# Patient Record
Sex: Female | Born: 1980 | Hispanic: Yes | Marital: Married | Smoking: Never smoker
Health system: Southern US, Community
[De-identification: ages and names within clinical notes are randomized; demographics above are authoritative.]

## PROBLEM LIST (undated history)

## (undated) DIAGNOSIS — R112 Nausea with vomiting, unspecified: Secondary | ICD-10-CM

## (undated) DIAGNOSIS — Z9889 Other specified postprocedural states: Secondary | ICD-10-CM

## (undated) DIAGNOSIS — D649 Anemia, unspecified: Secondary | ICD-10-CM

## (undated) DIAGNOSIS — K219 Gastro-esophageal reflux disease without esophagitis: Secondary | ICD-10-CM

## (undated) HISTORY — PX: HEMORRHOID SURGERY: SHX153

## (undated) HISTORY — DX: Anemia, unspecified: D64.9

---

## 2021-08-30 ENCOUNTER — Other Ambulatory Visit: Payer: Self-pay

## 2021-08-30 DIAGNOSIS — Z1231 Encounter for screening mammogram for malignant neoplasm of breast: Secondary | ICD-10-CM

## 2021-09-02 ENCOUNTER — Other Ambulatory Visit: Payer: Self-pay

## 2021-09-02 DIAGNOSIS — N644 Mastodynia: Secondary | ICD-10-CM

## 2021-09-23 ENCOUNTER — Other Ambulatory Visit: Payer: Self-pay

## 2021-09-23 ENCOUNTER — Ambulatory Visit: Payer: Self-pay | Admitting: *Deleted

## 2021-09-23 ENCOUNTER — Ambulatory Visit: Payer: Self-pay

## 2021-09-23 ENCOUNTER — Ambulatory Visit
Admission: RE | Admit: 2021-09-23 | Discharge: 2021-09-23 | Disposition: A | Discharge status: Home / Self Care | Payer: No Typology Code available for payment source | Source: Ambulatory Visit | Attending: Obstetrics and Gynecology | Admitting: Obstetrics and Gynecology

## 2021-09-23 ENCOUNTER — Other Ambulatory Visit: Payer: Self-pay | Admitting: Obstetrics and Gynecology

## 2021-09-23 VITALS — BP 126/80 | Wt 137.0 lb

## 2021-09-23 DIAGNOSIS — N644 Mastodynia: Secondary | ICD-10-CM

## 2021-09-23 DIAGNOSIS — N6489 Other specified disorders of breast: Secondary | ICD-10-CM

## 2021-09-23 DIAGNOSIS — Z01419 Encounter for gynecological examination (general) (routine) without abnormal findings: Secondary | ICD-10-CM

## 2021-09-23 NOTE — Progress Notes (Addendum)
[name] is a 41 y.o. No obstetric history on file. female who presents to [facility] clinic today with complaint of bilateral outer breast pain x 3-4 years that comes and goes. Patient rates the pain at a 10 out of 10. Patient complained of a left breast lump x 2 years.    Pap Smear: Pap smear completed today. Last Pap smear was 3-4 years ago at a clinic in [place] and was normal. Per patient has no history of an abnormal Pap smear. Last Pap smear result is not available in Epic.   Physical exam: Breasts Breasts symmetrical. No skin abnormalities bilateral breasts. No nipple retraction bilateral breasts. No nipple discharge bilateral breasts. No lymphadenopathy. No lumps palpated bilateral breasts. Unable to palpate any lumps in patients area of concern within the left breast. Complained of bilateral outer breast pain on exam.      MS DIGITAL DIAG TOMO BILAT  Result Date: 09/23/2021 CLINICAL DATA:  Patient presents for diffuse bilateral breast tenderness. EXAM: DIGITAL DIAGNOSTIC BILATERAL MAMMOGRAM WITH TOMOSYNTHESIS AND CAD; ULTRASOUND RIGHT BREAST LIMITED TECHNIQUE: Bilateral digital diagnostic mammography and breast tomosynthesis was performed. The images were evaluated with computer-aided detection.; Targeted ultrasound examination of the right breast was performed COMPARISON:  None. ACR Breast Density Category c: The breast tissue is heterogeneously dense, which may obscure small masses. FINDINGS: Within the upper-outer right breast middle depth there is persistent subtle distortion, further evaluated with additional imaging. No additional concerning findings within either breast. On physical exam, dense tissue is palpated upper-outer right breast. Targeted ultrasound is performed, showing no sonographic correlate for the subtle distortion within the upper-outer right breast. No right axillary adenopathy. IMPRESSION: Persistent subtle distortion upper-outer right breast. RECOMMENDATION:  Stereotactic guided core needle biopsy subtle distortion upper-outer right breast. I have discussed the findings and recommendations with the patient. If applicable, a reminder letter will be sent to the patient regarding the next appointment. BI-RADS CATEGORY  4: Suspicious. Electronically Signed   By: Lovey Newcomer M.D.   On: 09/23/2021 14:40    Pelvic/Bimanual Ext Genitalia No lesions, no swelling and no discharge observed on external genitalia.        Vagina Vagina pink and normal texture. No lesions or discharge observed in vagina.        Cervix Cervix is present. Cervix pink and of normal texture. IUD strings visualized. Thick yellow mucous like discharge observed on IUD strings. Patient stated she has had her IUD for 8 years and that she would like it renewed. Patient unsure which type of IUD she has. Explained to patient that if it is a 5-year IUD that she needs to use another form of birth control. Explained to [facility] that [facility] will not cover to have her IUD removed. Explained the [facility] Application and patient given the application to complete. Referral sent to the [facility] for [place] for follow up.   Uterus Uterus is present and palpable. Uterus in normal position and normal size.        Adnexae Bilateral ovaries present and palpable. No tenderness on palpation.         Rectovaginal No rectal exam completed today since patient had no rectal complaints. No skin abnormalities observed on exam.     Smoking History: Patient has never smoked.   Patient Navigation: Patient education provided. Access to services provided for patient through [place] program. Spanish interpreter Rudene Anda from [facility] provided.    Breast and Cervical Cancer Risk Assessment:  Patient does not have family history of breast cancer, known genetic mutations, or radiation treatment to the chest before age 62. Patient does not have history of cervical dysplasia,  immunocompromised, or DES exposure in-utero.  Risk Assessment     Risk Scores       09/23/2021   Last edited by: Demetrius Revel, LPN   5-year risk: 0.4 %   Lifetime risk: 7.2 %             A: [facility] exam with pap smear Complaint of bilateral outer breast pain.  P: Referred patient to the [facility] for a diagnostic mammogram. Appointment scheduled Thursday, September 23, 2021 at 1240.  Loletta Parish, RN 09/23/2021 11:08 AM

## 2021-09-23 NOTE — Patient Instructions (Signed)
Explained breast self awareness with [name]. Pap smear completed today. Let her know [facility] will cover Pap smears and HPV typing every 5 years unless has a history of abnormal Pap smears. Referred patient to the [facility] for a diagnostic mammogram. Appointment scheduled Thursday, September 23, 2021 at 1240. Patient aware of appointment and will be there. Let patient know will follow up with her within the next couple weeks with results of her Pap smear by phone. [name] verbalized understanding.  Adalin Vanderploeg, Kathaleen Maser, RN 11:08 AM

## 2021-09-24 LAB — CYTOLOGY - PAP
Comment: NEGATIVE
Diagnosis: NEGATIVE
High risk HPV: NEGATIVE

## 2021-09-28 ENCOUNTER — Telehealth: Payer: Self-pay | Admitting: Family Medicine

## 2021-09-28 NOTE — Telephone Encounter (Signed)
Called patient with interpreter to schedule appointment, there was no answer so a voicemail was left with the call back number for the office.

## 2021-09-29 ENCOUNTER — Other Ambulatory Visit: Payer: Self-pay

## 2021-09-29 ENCOUNTER — Telehealth: Payer: Self-pay

## 2021-09-29 MED ORDER — METRONIDAZOLE 500 MG PO TABS: 500.0000 mg | ORAL_TABLET | Freq: Two times a day (BID) | ORAL | 0 refills | Status: AC

## 2021-09-29 MED ORDER — FLUCONAZOLE 150 MG PO TABS: 150.0000 mg | ORAL_TABLET | Freq: Once | ORAL | 0 refills | Status: AC

## 2021-09-29 NOTE — Telephone Encounter (Signed)
Via Natale Lay, Spanish Interpreter [facility]), Patient informed Pap/HPV negative, next pap due in 5 years. Patient also informed pap smear was positive for yeast infection and bacterial vaginitis. Patient stated that she is having some vaginal itching, odor, aned discharge-green. Prescriptions for Diflucan and Flagyl called to [facility] ([address] Patient informed (per Fonnie Mu, RN) to complete Flagyl first, then take Diflucan, and needs to be evaluated for green discharge at the [facility]. Patient verbalized understanding.

## 2021-10-06 ENCOUNTER — Ambulatory Visit
Admission: RE | Admit: 2021-10-06 | Discharge: 2021-10-06 | Disposition: A | Discharge status: Home / Self Care | Payer: No Typology Code available for payment source | Source: Ambulatory Visit | Attending: Obstetrics and Gynecology | Admitting: Obstetrics and Gynecology

## 2021-10-06 DIAGNOSIS — N644 Mastodynia: Secondary | ICD-10-CM

## 2021-10-10 ENCOUNTER — Telehealth: Payer: Self-pay | Admitting: Radiology

## 2021-10-10 NOTE — Telephone Encounter (Signed)
Left voicemail via interpreter to call [facility] to schedule New GYN appt for IUD removal (has had IUD for 8 yrs). No insurance, patient aware that [facility] will not cover IUD removal. Patient was given [facility] Financial assistance application.

## 2021-11-04 ENCOUNTER — Ambulatory Visit: Payer: Self-pay | Admitting: Surgery

## 2021-11-04 DIAGNOSIS — N6311 Unspecified lump in the right breast, upper outer quadrant: Secondary | ICD-10-CM

## 2021-11-04 DIAGNOSIS — N6310 Unspecified lump in the right breast, unspecified quadrant: Secondary | ICD-10-CM

## 2021-11-05 ENCOUNTER — Other Ambulatory Visit: Payer: Self-pay | Admitting: Surgery

## 2021-11-05 DIAGNOSIS — N6310 Unspecified lump in the right breast, unspecified quadrant: Secondary | ICD-10-CM

## 2021-11-19 ENCOUNTER — Other Ambulatory Visit: Payer: Self-pay

## 2021-11-19 ENCOUNTER — Encounter (HOSPITAL_BASED_OUTPATIENT_CLINIC_OR_DEPARTMENT_OTHER): Payer: Self-pay | Admitting: Surgery

## 2021-11-23 ENCOUNTER — Ambulatory Visit: Payer: Self-pay | Admitting: Surgery

## 2021-11-23 DIAGNOSIS — N6489 Other specified disorders of breast: Secondary | ICD-10-CM

## 2021-11-29 ENCOUNTER — Ambulatory Visit
Admission: RE | Admit: 2021-11-29 | Discharge: 2021-11-29 | Disposition: A | Discharge status: Home / Self Care | Payer: No Typology Code available for payment source | Source: Ambulatory Visit | Attending: Surgery | Admitting: Surgery

## 2021-11-29 NOTE — Anesthesia Preprocedure Evaluation (Addendum)
Anesthesia Evaluation  ?Patient identified by MRN, date of birth, ID band ?Patient awake ? ? ? ?Reviewed: ?Allergy & Precautions, NPO status , Patient's Chart, lab work & pertinent test results ? ?History of Anesthesia Complications ?(+) PONV and history of anesthetic complications ? ?Airway ?Mallampati: II ? ?TM Distance: >3 FB ?Neck ROM: Full ? ? ? Dental ?no notable dental hx. ? ?  ?Pulmonary ?neg pulmonary ROS,  ?  ?Pulmonary exam normal ? ? ? ? ? ? ? Cardiovascular ?negative cardio ROS ?Normal cardiovascular exam ? ? ?  ?Neuro/Psych ?negative neurological ROS ? negative psych ROS  ? GI/Hepatic ?Neg liver ROS, GERD  Controlled,  ?Endo/Other  ?negative endocrine ROS ? Renal/GU ?negative Renal ROS  ?negative genitourinary ?  ?Musculoskeletal ?negative musculoskeletal ROS ?(+)  ? Abdominal ?  ?Peds ? Hematology ?negative hematology ROS ?(+)   ?Anesthesia Other Findings ?RIGHT BREAST RADIAL SCAR ? Reproductive/Obstetrics ?negative OB ROS ? ?  ? ? ? ? ? ? ? ? ? ? ? ? ? ?  ?  ? ? ? ? ? ? ? ?Anesthesia Physical ?Anesthesia Plan ? ?ASA: 2 ? ?Anesthesia Plan: General  ? ?Post-op Pain Management: Tylenol PO (pre-op)* and Toradol IV (intra-op)*  ? ?Induction: Intravenous ? ?PONV Risk Score and Plan: 4 or greater and Midazolam, Scopolamine patch - Pre-op, Treatment may vary due to age or medical condition, Dexamethasone and Ondansetron ? ?Airway Management Planned: LMA ? ?Additional Equipment: None ? ?Intra-op Plan:  ? ?Post-operative Plan: Extubation in OR ? ?Informed Consent: I have reviewed the patients History and Physical, chart, labs and discussed the procedure including the risks, benefits and alternatives for the proposed anesthesia with the patient or authorized representative who has indicated his/her understanding and acceptance.  ? ? ? ?Dental advisory given and Interpreter used for interveiw ? ?Plan Discussed with: CRNA ? ?Anesthesia Plan Comments:   ? ? ? ? ? ?Anesthesia Quick  Evaluation ? ?

## 2021-11-29 NOTE — Progress Notes (Signed)

## 2021-11-30 ENCOUNTER — Ambulatory Visit (HOSPITAL_BASED_OUTPATIENT_CLINIC_OR_DEPARTMENT_OTHER)
Admission: RE | Admit: 2021-11-30 | Discharge: 2021-11-30 | Disposition: A | Discharge status: Home / Self Care | Payer: No Typology Code available for payment source | Source: Ambulatory Visit | Attending: Surgery | Admitting: Surgery

## 2021-11-30 ENCOUNTER — Encounter (HOSPITAL_BASED_OUTPATIENT_CLINIC_OR_DEPARTMENT_OTHER)
Admission: RE | Disposition: A | Discharge status: Home / Self Care | Payer: Self-pay | Source: Ambulatory Visit | Attending: Surgery

## 2021-11-30 ENCOUNTER — Other Ambulatory Visit: Payer: Self-pay

## 2021-11-30 ENCOUNTER — Ambulatory Visit
Admission: RE | Admit: 2021-11-30 | Discharge: 2021-11-30 | Disposition: A | Discharge status: Home / Self Care | Payer: No Typology Code available for payment source | Source: Ambulatory Visit | Attending: Surgery | Admitting: Surgery

## 2021-11-30 ENCOUNTER — Ambulatory Visit (HOSPITAL_BASED_OUTPATIENT_CLINIC_OR_DEPARTMENT_OTHER): Payer: No Typology Code available for payment source | Admitting: Anesthesiology

## 2021-11-30 ENCOUNTER — Encounter (HOSPITAL_BASED_OUTPATIENT_CLINIC_OR_DEPARTMENT_OTHER): Payer: Self-pay | Admitting: Surgery

## 2021-11-30 DIAGNOSIS — K219 Gastro-esophageal reflux disease without esophagitis: Secondary | ICD-10-CM | POA: Insufficient documentation

## 2021-11-30 DIAGNOSIS — N6310 Unspecified lump in the right breast, unspecified quadrant: Secondary | ICD-10-CM

## 2021-11-30 DIAGNOSIS — N6021 Fibroadenosis of right breast: Secondary | ICD-10-CM | POA: Insufficient documentation

## 2021-11-30 DIAGNOSIS — N6489 Other specified disorders of breast: Secondary | ICD-10-CM

## 2021-11-30 HISTORY — PX: BREAST LUMPECTOMY WITH RADIOACTIVE SEED LOCALIZATION: SHX6424

## 2021-11-30 HISTORY — DX: Nausea with vomiting, unspecified: Z98.890

## 2021-11-30 HISTORY — DX: Other specified postprocedural states: R11.2

## 2021-11-30 HISTORY — DX: Gastro-esophageal reflux disease without esophagitis: K21.9

## 2021-11-30 LAB — POCT PREGNANCY, URINE: Preg Test, Ur: NEGATIVE

## 2021-11-30 SURGERY — BREAST LUMPECTOMY WITH RADIOACTIVE SEED LOCALIZATION
Anesthesia: General | Site: Breast | Laterality: Right

## 2021-11-30 MED ORDER — ONDANSETRON HCL 4 MG/2ML IJ SOLN
INTRAMUSCULAR | Status: DC | PRN
  Administered 2021-11-30: 4 mg via INTRAVENOUS

## 2021-11-30 MED ORDER — ACETAMINOPHEN 500 MG PO TABS
ORAL_TABLET | ORAL | Status: AC
  Filled 2021-11-30: qty 2

## 2021-11-30 MED ORDER — KETOROLAC TROMETHAMINE 30 MG/ML IJ SOLN
INTRAMUSCULAR | Status: AC
  Filled 2021-11-30: qty 1

## 2021-11-30 MED ORDER — FENTANYL CITRATE (PF) 100 MCG/2ML IJ SOLN
INTRAMUSCULAR | Status: AC
  Filled 2021-11-30: qty 2

## 2021-11-30 MED ORDER — LIDOCAINE 2% (20 MG/ML) 5 ML SYRINGE
INTRAMUSCULAR | Status: AC
  Filled 2021-11-30: qty 5

## 2021-11-30 MED ORDER — SCOPOLAMINE 1 MG/3DAYS TD PT72
1.0000 | MEDICATED_PATCH | Freq: Once | TRANSDERMAL | Status: DC
  Administered 2021-11-30: 1.5 mg via TRANSDERMAL

## 2021-11-30 MED ORDER — FENTANYL CITRATE (PF) 100 MCG/2ML IJ SOLN
INTRAMUSCULAR | Status: DC | PRN
  Administered 2021-11-30 (×2): 25 ug via INTRAVENOUS
  Administered 2021-11-30: 50 ug via INTRAVENOUS

## 2021-11-30 MED ORDER — PHENYLEPHRINE 40 MCG/ML (10ML) SYRINGE FOR IV PUSH (FOR BLOOD PRESSURE SUPPORT)
PREFILLED_SYRINGE | INTRAVENOUS | Status: AC
  Filled 2021-11-30: qty 10

## 2021-11-30 MED ORDER — CHLORHEXIDINE GLUCONATE CLOTH 2 % EX PADS: 6.0000 | MEDICATED_PAD | Freq: Once | CUTANEOUS | Status: DC

## 2021-11-30 MED ORDER — MIDAZOLAM HCL 5 MG/5ML IJ SOLN
INTRAMUSCULAR | Status: DC | PRN
  Administered 2021-11-30: 2 mg via INTRAVENOUS

## 2021-11-30 MED ORDER — KETOROLAC TROMETHAMINE 30 MG/ML IJ SOLN
INTRAMUSCULAR | Status: DC | PRN
Start: 2021-11-30 — End: 2021-11-30
  Administered 2021-11-30: 30 mg via INTRAVENOUS

## 2021-11-30 MED ORDER — CEFAZOLIN IN SODIUM CHLORIDE 3-0.9 GM/100ML-% IV SOLN
3.0000 g | INTRAVENOUS | Status: AC
  Administered 2021-11-30: 2 g via INTRAVENOUS

## 2021-11-30 MED ORDER — DEXAMETHASONE SODIUM PHOSPHATE 10 MG/ML IJ SOLN
INTRAMUSCULAR | Status: AC
  Filled 2021-11-30: qty 1

## 2021-11-30 MED ORDER — LACTATED RINGERS IV SOLN: INTRAVENOUS | Status: DC

## 2021-11-30 MED ORDER — ONDANSETRON HCL 4 MG/2ML IJ SOLN
INTRAMUSCULAR | Status: AC
  Filled 2021-11-30: qty 2

## 2021-11-30 MED ORDER — ACETAMINOPHEN 500 MG PO TABS: 1000.0000 mg | ORAL_TABLET | ORAL | Status: AC

## 2021-11-30 MED ORDER — PROPOFOL 10 MG/ML IV BOLUS
INTRAVENOUS | Status: DC | PRN
  Administered 2021-11-30: 200 mg via INTRAVENOUS

## 2021-11-30 MED ORDER — OXYCODONE HCL 5 MG PO TABS: 5.0000 mg | ORAL_TABLET | Freq: Once | ORAL | Status: DC | PRN

## 2021-11-30 MED ORDER — SODIUM CHLORIDE 0.9 % IV SOLN
INTRAVENOUS | Status: DC | PRN
  Administered 2021-11-30: 500 mL

## 2021-11-30 MED ORDER — PROPOFOL 500 MG/50ML IV EMUL
INTRAVENOUS | Status: AC
  Filled 2021-11-30: qty 50

## 2021-11-30 MED ORDER — SCOPOLAMINE 1 MG/3DAYS TD PT72
MEDICATED_PATCH | TRANSDERMAL | Status: AC
  Filled 2021-11-30: qty 1

## 2021-11-30 MED ORDER — FENTANYL CITRATE (PF) 100 MCG/2ML IJ SOLN: 25.0000 ug | INTRAMUSCULAR | Status: DC | PRN

## 2021-11-30 MED ORDER — DROPERIDOL 2.5 MG/ML IJ SOLN: 0.6250 mg | Freq: Once | INTRAMUSCULAR | Status: DC | PRN

## 2021-11-30 MED ORDER — CEFAZOLIN SODIUM-DEXTROSE 2-4 GM/100ML-% IV SOLN
INTRAVENOUS | Status: AC
  Filled 2021-11-30: qty 100

## 2021-11-30 MED ORDER — OXYCODONE HCL 5 MG PO TABS: 5.0000 mg | ORAL_TABLET | Freq: Four times a day (QID) | ORAL | 0 refills | Status: DC | PRN

## 2021-11-30 MED ORDER — ACETAMINOPHEN 500 MG PO TABS
1000.0000 mg | ORAL_TABLET | Freq: Once | ORAL | Status: AC
Start: 2021-11-30 — End: 2021-11-30
  Administered 2021-11-30: 1000 mg via ORAL

## 2021-11-30 MED ORDER — MIDAZOLAM HCL 2 MG/2ML IJ SOLN
INTRAMUSCULAR | Status: AC
  Filled 2021-11-30: qty 2

## 2021-11-30 MED ORDER — DEXAMETHASONE SODIUM PHOSPHATE 4 MG/ML IJ SOLN
INTRAMUSCULAR | Status: DC | PRN
  Administered 2021-11-30: 8 mg via INTRAVENOUS

## 2021-11-30 MED ORDER — BUPIVACAINE-EPINEPHRINE (PF) 0.25% -1:200000 IJ SOLN
INTRAMUSCULAR | Status: DC | PRN
  Administered 2021-11-30: 20 mL

## 2021-11-30 MED ORDER — IBUPROFEN 800 MG PO TABS: 800.0000 mg | ORAL_TABLET | Freq: Three times a day (TID) | ORAL | 0 refills | Status: DC | PRN

## 2021-11-30 MED ORDER — LIDOCAINE HCL (CARDIAC) PF 100 MG/5ML IV SOSY
PREFILLED_SYRINGE | INTRAVENOUS | Status: DC | PRN
  Administered 2021-11-30: 60 mg via INTRAVENOUS

## 2021-11-30 MED ORDER — BUPIVACAINE-EPINEPHRINE (PF) 0.25% -1:200000 IJ SOLN
INTRAMUSCULAR | Status: AC
  Filled 2021-11-30: qty 30

## 2021-11-30 MED ORDER — OXYCODONE HCL 5 MG/5ML PO SOLN: 5.0000 mg | Freq: Once | ORAL | Status: DC | PRN

## 2021-11-30 MED ORDER — PHENYLEPHRINE 40 MCG/ML (10ML) SYRINGE FOR IV PUSH (FOR BLOOD PRESSURE SUPPORT)
PREFILLED_SYRINGE | INTRAVENOUS | Status: DC | PRN
  Administered 2021-11-30: 80 ug via INTRAVENOUS

## 2021-11-30 MED ORDER — SODIUM CHLORIDE 0.9 % IV SOLN
INTRAVENOUS | Status: AC
  Filled 2021-11-30: qty 10

## 2021-11-30 SURGICAL SUPPLY — 46 items
APPLIER CLIP 9.375 MED OPEN (MISCELLANEOUS)
BINDER BREAST LRG (GAUZE/BANDAGES/DRESSINGS) ×1 IMPLANT
BINDER BREAST MEDIUM (GAUZE/BANDAGES/DRESSINGS) IMPLANT
BLADE SURG 15 STRL LF DISP TIS (BLADE) ×1 IMPLANT
BLADE SURG 15 STRL SS (BLADE) ×1
CANISTER SUC SOCK COL 7IN (MISCELLANEOUS) IMPLANT
CANISTER SUCT 1200ML W/VALVE (MISCELLANEOUS) IMPLANT
CHLORAPREP W/TINT 26 (MISCELLANEOUS) ×2 IMPLANT
CLIP APPLIE 9.375 MED OPEN (MISCELLANEOUS) IMPLANT
COVER BACK TABLE 60X90IN (DRAPES) ×2 IMPLANT
COVER MAYO STAND STRL (DRAPES) ×2 IMPLANT
COVER PROBE W GEL 5X96 (DRAPES) ×2 IMPLANT
DERMABOND ADVANCED (GAUZE/BANDAGES/DRESSINGS) ×1
DERMABOND ADVANCED .7 DNX12 (GAUZE/BANDAGES/DRESSINGS) ×1 IMPLANT
DRAPE LAPAROTOMY 100X72 PEDS (DRAPES) ×2 IMPLANT
DRAPE UTILITY XL STRL (DRAPES) ×2 IMPLANT
ELECT COATED BLADE 2.86 ST (ELECTRODE) ×2 IMPLANT
ELECT REM PT RETURN 9FT ADLT (ELECTROSURGICAL) ×2
ELECTRODE REM PT RTRN 9FT ADLT (ELECTROSURGICAL) ×1 IMPLANT
GLOVE SRG 8 PF TXTR STRL LF DI (GLOVE) ×1 IMPLANT
GLOVE SURG LTX SZ8 (GLOVE) ×2 IMPLANT
GLOVE SURG POLYISO LF SZ6.5 (GLOVE) ×1 IMPLANT
GLOVE SURG UNDER POLY LF SZ7 (GLOVE) ×1 IMPLANT
GLOVE SURG UNDER POLY LF SZ8 (GLOVE) ×1
GOWN STRL REUS W/ TWL LRG LVL3 (GOWN DISPOSABLE) ×2 IMPLANT
GOWN STRL REUS W/ TWL XL LVL3 (GOWN DISPOSABLE) ×1 IMPLANT
GOWN STRL REUS W/TWL LRG LVL3 (GOWN DISPOSABLE) ×2
GOWN STRL REUS W/TWL XL LVL3 (GOWN DISPOSABLE) ×1
HEMOSTAT ARISTA ABSORB 3G PWDR (HEMOSTASIS) ×1 IMPLANT
HEMOSTAT SNOW SURGICEL 2X4 (HEMOSTASIS) IMPLANT
KIT MARKER MARGIN INK (KITS) ×2 IMPLANT
NDL HYPO 25X1 1.5 SAFETY (NEEDLE) ×1 IMPLANT
NEEDLE HYPO 25X1 1.5 SAFETY (NEEDLE) ×2 IMPLANT
NS IRRIG 1000ML POUR BTL (IV SOLUTION) ×2 IMPLANT
PACK BASIN DAY SURGERY FS (CUSTOM PROCEDURE TRAY) ×2 IMPLANT
PENCIL SMOKE EVACUATOR (MISCELLANEOUS) ×2 IMPLANT
SLEEVE SCD COMPRESS KNEE MED (STOCKING) ×2 IMPLANT
SPONGE T-LAP 4X18 ~~LOC~~+RFID (SPONGE) ×2 IMPLANT
SUT MNCRL AB 4-0 PS2 18 (SUTURE) ×2 IMPLANT
SUT SILK 2 0 SH (SUTURE) IMPLANT
SUT VICRYL 3-0 CR8 SH (SUTURE) ×2 IMPLANT
SYR CONTROL 10ML LL (SYRINGE) ×2 IMPLANT
TOWEL GREEN STERILE FF (TOWEL DISPOSABLE) ×2 IMPLANT
TRAY FAXITRON CT DISP (TRAY / TRAY PROCEDURE) ×2 IMPLANT
TUBE CONNECTING 20X1/4 (TUBING) ×1 IMPLANT
YANKAUER SUCT BULB TIP NO VENT (SUCTIONS) ×1 IMPLANT

## 2021-11-30 NOTE — Interval H&P Note (Signed)
History and Physical Interval Note: ? ?11/30/2021 ?7:18 AM ? ?Dana Gibbs  has presented today for surgery, with the diagnosis of RIGHT BREAST RADIAL SCAR.  The various methods of treatment have been discussed with the patient and family. After consideration of risks, benefits and other options for treatment, the patient has consented to  Procedure(s): ?RIGHT BREAST LUMPECTOMY WITH RADIOACTIVE SEED LOCALIZATION (Right) as a surgical intervention.  The patient's history has been reviewed, patient examined, no change in status, stable for surgery.  I have reviewed the patient's chart and labs.  Questions were answered to the patient's satisfaction.   ? ? ?Dana Gibbs ? ? ?

## 2021-11-30 NOTE — H&P (Signed)
History of Present Illness: ?Dana Gibbs is a 41 y.o. female who is seen today as an office consultation at the request of Dr. Elly Modena for evaluation of New Consultation (Right Breast ) ?.  ? ?Patient seen today for evaluation of abnormal mammogram. She was noted to have an area of density of the right breast. Core biopsy showed radial scar. No family history of breast cancer. No history of pain or discharge. No history of mass. A Spanish translator was utilized to communicate with the patient. ? ?Review of Systems: ?A complete review of systems was obtained from the patient. I have reviewed this information and discussed as appropriate with the patient. See HPI as well for other ROS. ? ? ? ?Medical History: ?Past Medical History:  ?Diagnosis Date  ? Anemia  ? GERD (gastroesophageal reflux disease)  ? ?There is no problem list on file for this patient. ? ?Past Surgical History:  ?Procedure Laterality Date  ? Hemorrhoids 2011  ? CESAREAN SECTION  ?[id number]  ? ? ?No Known Allergies ? ?Current Outpatient Medications on File Prior to Visit  ?Medication Sig Dispense Refill  ? multivitamin (MULTIPLE VITAMINS DAILY ORAL) Take 1 tablet by mouth once daily  ? ?No current facility-administered medications on file prior to visit.  ? ?History reviewed. No pertinent family history.  ? ?Social History  ? ?Tobacco Use  ?Smoking Status Never  ?Smokeless Tobacco Never  ? ? ?Social History  ? ?Socioeconomic History  ? Marital status: Married  ?Tobacco Use  ? Smoking status: Never  ? Smokeless tobacco: Never  ?Vaping Use  ? Vaping Use: Never used  ?Substance and Sexual Activity  ? Alcohol use: Not Currently  ? Drug use: Never  ? ?Objective:  ? ?Vitals:  ?10/29/21 1110  ?BP: 120/80  ?Pulse: 80  ?Temp: 36.8 ?C (98.2 ?F)  ?SpO2: 99%  ?Weight: 63.9 kg (140 lb 12.8 oz)  ?Height: 154.9 cm (5\' 1" )  ? ?Body mass index is 26.6 kg/m?. ? ?Physical Exam ?Constitutional:  ?Appearance: Normal appearance.  ?Eyes:  ?Pupils: Pupils are equal,  round, and reactive to light.  ?Pulmonary:  ?Effort: Pulmonary effort is normal.  ?Chest:  ?Breasts: ?Right: Normal.  ?Left: Normal.  ?Musculoskeletal:  ?Cervical back: Normal range of motion and neck supple.  ?Neurological:  ?Mental Status: She is alert.  ? ? ? ?Labs, Imaging and Diagnostic Testing: ?Right breast core biopsy shows radial scar. Right breast distortion noted upper outer quadrant ? ?Assessment and Plan:  ? ?Diagnoses and all orders for this visit: ? ?Radial scar of breast ? ? ? ?Patent attorney used ? ?Discussed right breast lumpectomy and the pros and cons of this. Discussed observation. Discussed upgrade risk of being up to 10% in some cases with this lesion. She would like to proceed with right breast seed localized lumpectomy if she cannot afford the financial side is. Risks and benefits discussed with the patient with the translator. Risk of bleeding, infection cosmetic deformity, how much tissue to be removed, expected recovery, and other alternatives discussed.The procedure has been discussed with the patient. Alternatives to surgery have been discussed with the patient. Risks of surgery include bleeding, Infection, Seroma formation, death, and the need for further surgery. The patient understands and wishes to proceed. ? ?No follow-ups on file. ? ?Kennieth Francois, MD  ?

## 2021-11-30 NOTE — Op Note (Signed)
. ?  Preoperative diagnosis: Right breast radial scar upper outer quadrant ? ?Postoperative diagnosis: Same ? ?Procedure: Right breast seed localized lumpectomy ? ?Surgeon: Erroll Luna, MD ? ?Anesthesia: LMA with 0.25% Marcaine plain ? ?EBL: 40 cc ? ?Drains: None ? ?Specimen: Right breast tissue with seed and clip verified by Faxitron ? ?IV fluids: Per anesthesia record ? ?Indications for procedure: The patient is a 41 year old female with a right breast mammographic abnormality.  Core biopsy revealed radial scar.  We discussed the pros and cons of removal.  We discussed potential upgrade risk to either a high risk lesion or invasive disease.  This risk was in the 5 to 10% range.  We discussed observation and close follow-up.  After discussion of the above she opted to proceed with right breast seed localized lumpectomy.  Of note a Spanish translator was used to communicate with the patient.The procedure has been discussed with the patient. Alternatives to surgery have been discussed with the patient.  Risks of surgery include bleeding,  Infection,  Seroma formation, death,  and the need for further surgery.   The patient understands and wishes to proceed.  ? ?Description of procedure: The patient was met in the holding area.  A translator was available for answering questions.  All questions were answered.  The right side was marked as the correct site.  Seed films were available for review.  Seed placed at the [facility].  This was done yesterday.  She was taken to the operating room.  She was placed upon upon the OR table.  After induction of general anesthesia, the right breast was prepped and draped in a sterile fashion.  Timeout performed.  She received appropriate preoperative antibiotics.  Neoprobe used to identify the seed right breast upper outer quadrant just adjacent to the nipple areolar complex.  A curvilinear incision was made along the lateral border of the nipple areolar complex  after infiltration with local anesthetic.  Dissection was carried down and the area was excised with the help of the neoprobe.  Hemostasis achieved with cautery and Arista.  The Faxitron image revealed the seed and clip to be present in the tissue.  Local anesthetic was infiltrated around the cavity.  The specimen was oriented with ink and sent to pathology.  After ensuring hemostasis, the cavity was closed with a deep layer 3-0 Vicryl.  4 Monocryl was used to close the skin in a subcuticular fashion.  Dermabond was applied.  Breast binder placed.  All counts were found to be correct.  The patient was awoke extubated taken to recovery in satisfactory condition. ?

## 2021-11-30 NOTE — Anesthesia Procedure Notes (Signed)
Procedure Name: LMA Insertion ?Date/Time: 11/30/2021 7:46 AM ?Performed by: Lance Coon, CRNA ?Pre-anesthesia Checklist: Patient identified, Emergency Drugs available, Suction available and Patient being monitored ?Patient Re-evaluated:Patient Re-evaluated prior to induction ?Oxygen Delivery Method: Circle system utilized ?Preoxygenation: Pre-oxygenation with 100% oxygen ?Induction Type: IV induction ?Ventilation: Mask ventilation without difficulty ?LMA: LMA inserted ?LMA Size: 3.0 ?Number of attempts: 1 ?Airway Equipment and Method: Bite block ?Placement Confirmation: positive ETCO2 ?Tube secured with: Tape ?Dental Injury: Teeth and Oropharynx as per pre-operative assessment  ? ? ? ? ?

## 2021-11-30 NOTE — Discharge Instructions (Addendum)
[facility] ?Office Phone Number [phone] ? ?BREAST BIOPSY/ PARTIAL MASTECTOMY: POST OP INSTRUCTIONS ? ?Always review your discharge instruction sheet given to you by the facility where your surgery was performed. ? ?IF YOU HAVE DISABILITY OR FAMILY LEAVE FORMS, YOU MUST BRING THEM TO THE OFFICE FOR PROCESSING.  DO NOT GIVE THEM TO YOUR DOCTOR. ? ?A prescription for pain medication may be given to you upon discharge.  Take your pain medication as prescribed, if needed.  If narcotic pain medicine is not needed, then you may take acetaminophen (Tylenol) or ibuprofen (Advil) as needed. ?Take your usually prescribed medications unless otherwise directed ?If you need a refill on your pain medication, please contact your pharmacy.  They will contact our office to request authorization.  Prescriptions will not be filled after 5pm or on week-ends. ?You should eat very light the first 24 hours after surgery, such as soup, crackers, pudding, etc.  Resume your normal diet the day after surgery. ?Most patients will experience some swelling and bruising in the breast.  Ice packs and a good support bra will help.  Swelling and bruising can take several days to resolve.  ?It is common to experience some constipation if taking pain medication after surgery.  Increasing fluid intake and taking a stool softener will usually help or prevent this problem from occurring.  A mild laxative (Milk of Magnesia or Miralax) should be taken according to package directions if there are no bowel movements after 48 hours. ?Unless discharge instructions indicate otherwise, you may remove your bandages 24-48 hours after surgery, and you may shower at that time.  You may have steri-strips (small skin tapes) in place directly over the incision.  These strips should be left on the skin for 7-10 days.  If your surgeon used skin glue on the incision, you may shower in 24 hours.  The glue will flake off over the next 2-3 weeks.  Any  sutures or staples will be removed at the office during your follow-up visit. ?ACTIVITIES:  You may resume regular daily activities (gradually increasing) beginning the next day.  Wearing a good support bra or sports bra minimizes pain and swelling.  You may have sexual intercourse when it is comfortable. ?You may drive when you no longer are taking prescription pain medication, you can comfortably wear a seatbelt, and you can safely maneuver your car and apply brakes. ?RETURN TO WORK:  _____1 - 2 weeks _________________________________________________________________________________ ?You should see your doctor in the office for a follow-up appointment approximately two weeks after your surgery.  Your doctor?s nurse will typically make your follow-up appointment when she calls you with your pathology report.  Expect your pathology report 2-3 business days after your surgery.  You may call to check if you do not hear from [place] after three days. ?OTHER INSTRUCTIONS: _______________________________________________________________________________________________ _____________________________________________________________________________________________________________________________________ ?_____________________________________________________________________________________________________________________________________ ?_____________________________________________________________________________________________________________________________________ ? ?WHEN TO CALL YOUR DOCTOR: ?Fever over 101.0 ?Nausea and/or vomiting. ?Extreme swelling or bruising. ?Continued bleeding from incision. ?Increased pain, redness, or drainage from the incision. ? ?The clinic staff is available to answer your questions during regular business hours.  Please don?t hesitate to call and ask to speak to one of the nurses for clinical concerns.  If you have a medical emergency, go to the nearest emergency room or call 911.  A surgeon from  [facility] is always on call at the hospital. ? ?For further questions, please visit [email or web address]   ? ?No Tylenol until after 12:40pm today if needed ? ?Post Anesthesia  Home Care Instructions ? ?Activity: ?Get plenty of rest for the remainder of the day. A responsible individual must stay with you for 24 hours following the procedure.  ?For the next 24 hours, DO NOT: ?-Drive a car ?-Paediatric nurse ?-Drink alcoholic beverages ?-Take any medication unless instructed by your physician ?-Make any legal decisions or sign important papers. ? ?Meals: ?Start with liquid foods such as gelatin or soup. Progress to regular foods as tolerated. Avoid greasy, spicy, heavy foods. If nausea and/or vomiting occur, drink only clear liquids until the nausea and/or vomiting subsides. Call your physician if vomiting continues. ? ?Special Instructions/Symptoms: ?Your throat may feel dry or sore from the anesthesia or the breathing tube placed in your throat during surgery. If this causes discomfort, gargle with warm salt water. The discomfort should disappear within 24 hours. ? ?If you had a scopolamine patch placed behind your ear for the management of post- operative nausea and/or vomiting: ? ?1. The medication in the patch is effective for 72 hours, after which it should be removed.  Wrap patch in a tissue and discard in the trash. Wash hands thoroughly with soap and water. ?2. You may remove the patch earlier than 72 hours if you experience unpleasant side effects which may include dry mouth, dizziness or visual disturbances. ?3. Avoid touching the patch. Wash your hands with soap and water after contact with the patch. ?    ?

## 2021-11-30 NOTE — Anesthesia Postprocedure Evaluation (Signed)
Anesthesia Post Note ? ?Patient: [name] ? ?Procedure(s) Performed: RIGHT BREAST LUMPECTOMY WITH RADIOACTIVE SEED LOCALIZATION (Right: Breast) ? ?  ? ?Patient location during evaluation: PACU ?Anesthesia Type: General ?Level of consciousness: awake and alert ?Pain management: pain level controlled ?Vital Signs Assessment: post-procedure vital signs reviewed and stable ?Respiratory status: spontaneous breathing, nonlabored ventilation and respiratory function stable ?Cardiovascular status: blood pressure returned to baseline ?Postop Assessment: no apparent nausea or vomiting ?Anesthetic complications: no ? ? ?No notable events documented. ? ?Last Vitals:  ?Vitals:  ? 11/30/21 0848 11/30/21 0900  ?BP: (!) 100/59 111/69  ?Pulse: 65 67  ?Resp: 14 14  ?Temp: (!) 36.2 ?C   ?SpO2: 100% 100%  ?  ?Last Pain:  ?Vitals:  ? 11/30/21 0915  ?TempSrc:   ?PainSc: 0-No pain  ? ? ?  ?  ?  ?  ?  ?  ? ?Shanda Howells ? ? ? ? ?

## 2021-11-30 NOTE — Transfer of Care (Signed)
Immediate Anesthesia Transfer of Care Note ? ?Patient: Dana Gibbs ? ?Procedure(s) Performed: RIGHT BREAST LUMPECTOMY WITH RADIOACTIVE SEED LOCALIZATION (Right: Breast) ? ?Patient Location: PACU ? ?Anesthesia Type:General ? ?Level of Consciousness: sedated ? ?Airway & Oxygen Therapy: Patient Spontanous Breathing and Patient connected to face mask oxygen ? ?Post-op Assessment: Report given to RN and Post -op Vital signs reviewed and stable ? ?Post vital signs: Reviewed and stable ? ?Last Vitals:  ?Vitals Value Taken Time  ?BP 100/59 11/30/21 0848  ?Temp    ?Pulse 64 11/30/21 0849  ?Resp 21 11/30/21 0849  ?SpO2 100 % 11/30/21 0849  ?Vitals shown include unvalidated device data. ? ?Last Pain:  ?Vitals:  ? 11/30/21 0638  ?TempSrc: Oral  ?PainSc: 0-No pain  ?   ? ?  ? ?Complications: No notable events documented. ?

## 2021-12-01 ENCOUNTER — Encounter (HOSPITAL_BASED_OUTPATIENT_CLINIC_OR_DEPARTMENT_OTHER): Payer: Self-pay | Admitting: Surgery

## 2021-12-02 LAB — SURGICAL PATHOLOGY

## 2021-12-15 ENCOUNTER — Encounter (HOSPITAL_COMMUNITY): Payer: Self-pay

## 2022-03-03 NOTE — Progress Notes (Addendum)
Subjective:    Dana Gibbs niamya vittitow - 41 y.o. female MRN [id number]  Date of birth: 1981/06/03  HPI  Dana Hesselbach del Severa Jeremiah is to establish care.  Current issues and/or concerns: IUD in place for 9 years without complication. Reports during past cytology told IUD has expired. She is planning to have a baby soon as well. No further issues/concerns.    ROS per HPI     Health Maintenance:  Health Maintenance Due  Topic Date Due   COVID-19 Vaccine (1) Never done   HIV Screening  Never done   Hepatitis C Screening  Never done   TETANUS/TDAP  Never done    Past Medical History: There are no problems to display for this patient.   Social History   reports that she has never smoked. She has never been exposed to tobacco smoke. She has never used smokeless tobacco. She reports that she does not currently use alcohol. She reports that she does not use drugs.   Family History  family history includes Kidney disease in her father; Uterine cancer in her mother.   Medications: reviewed and updated   Objective:   Physical Exam BP 117/74 (BP Location: Left Arm, Patient Position: Sitting, Cuff Size: Normal)   Pulse 75   Temp 98.3 F (36.8 C)   Resp 16   Wt 136 lb (61.7 kg)   SpO2 99%   BMI 25.70 kg/m   Physical Exam Eyes:     Extraocular Movements: Extraocular movements intact.     Conjunctiva/sclera: Conjunctivae normal.     Pupils: Pupils are equal, round, and reactive to light.  Cardiovascular:     Rate and Rhythm: Normal rate and regular rhythm.     Pulses: Normal pulses.     Heart sounds: Normal heart sounds.  Pulmonary:     Effort: Pulmonary effort is normal.     Breath sounds: Normal breath sounds.  Musculoskeletal:     Cervical back: Normal range of motion and neck supple.  Neurological:     General: No focal deficit present.     Mental Status: She is alert and oriented to person, place, and time.  Psychiatric:        Mood  and Affect: Mood normal.        Behavior: Behavior normal.      Assessment & Plan:  1. Encounter to establish care - Patient presents today to establish care.  - Return for annual physical examination, labs, and health maintenance. Arrive fasting meaning having no food for at least 8 hours prior to appointment. You may have only water or black coffee. Please take scheduled medications as normal.  2. IUD (intrauterine device) in place - Referral to Gynecology for further evaluation and management. - Ambulatory referral to Gynecology  3. Language barrier -  in-person interpreter, Lilly     Patient was given clear instructions to go to Emergency Department or return to medical center if symptoms don't improve, worsen, or new problems develop.The patient verbalized understanding.  I discussed the assessment and treatment plan with the patient. The patient was provided an opportunity to ask questions and all were answered. The patient agreed with the plan and demonstrated an understanding of the instructions.   The patient was advised to call back or seek an in-person evaluation if the symptoms worsen or if the condition fails to improve as anticipated.    Ricky Stabs, NP 03/09/2022, 1:52 PM Primary Care at [facility]

## 2022-03-09 ENCOUNTER — Encounter: Payer: Self-pay | Admitting: Family

## 2022-03-09 VITALS — BP 117/74 | HR 75 | Temp 98.3°F | Resp 16 | Wt 136.0 lb

## 2022-03-09 DIAGNOSIS — Z7689 Persons encountering health services in other specified circumstances: Secondary | ICD-10-CM

## 2022-03-09 DIAGNOSIS — Z975 Presence of (intrauterine) contraceptive device: Secondary | ICD-10-CM

## 2022-03-09 DIAGNOSIS — Z789 Other specified health status: Secondary | ICD-10-CM

## 2022-03-09 NOTE — Addendum Note (Signed)
Addended by: Margorie John on: 03/09/2022 01:39 PM   Modules accepted: Orders

## 2022-03-09 NOTE — Addendum Note (Signed)
Addended by: Rema Fendt on: 03/09/2022 02:01 PM   Modules accepted: Orders, Level of Service

## 2022-03-09 NOTE — Progress Notes (Signed)
Pt presents to establish care needs referral to GYN due to 41 yr old IUD that was placed in [place]

## 2022-03-16 ENCOUNTER — Other Ambulatory Visit: Payer: Self-pay

## 2022-03-16 DIAGNOSIS — Z13220 Encounter for screening for lipoid disorders: Secondary | ICD-10-CM

## 2022-03-17 LAB — LIPID PANEL
Chol/HDL Ratio: 3 ratio (ref 0.0–4.4)
Cholesterol, Total: 195 mg/dL (ref 100–199)
HDL: 66 mg/dL (ref >39)
LDL Chol Calc (NIH): 113 mg/dL — ABNORMAL HIGH (ref 0–99)
Triglycerides: 87 mg/dL (ref 0–149)
VLDL Cholesterol Cal: 16 mg/dL (ref 5–40)
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
## 2022-04-13 ENCOUNTER — Encounter: Payer: Self-pay | Admitting: Family

## 2022-04-13 DIAGNOSIS — Z131 Encounter for screening for diabetes mellitus: Secondary | ICD-10-CM

## 2022-04-13 DIAGNOSIS — Z1159 Encounter for screening for other viral diseases: Secondary | ICD-10-CM

## 2022-04-13 DIAGNOSIS — Z789 Other specified health status: Secondary | ICD-10-CM

## 2022-04-13 DIAGNOSIS — Z114 Encounter for screening for human immunodeficiency virus [HIV]: Secondary | ICD-10-CM

## 2022-04-13 DIAGNOSIS — Z13228 Encounter for screening for other metabolic disorders: Secondary | ICD-10-CM

## 2022-04-13 DIAGNOSIS — Z0000 Encounter for general adult medical examination without abnormal findings: Secondary | ICD-10-CM

## 2022-04-13 DIAGNOSIS — Z130 Encounter for screening for diseases of the blood and blood-forming organs and certain disorders involving the immune mechanism: Secondary | ICD-10-CM

## 2022-04-13 DIAGNOSIS — Z1329 Encounter for screening for other suspected endocrine disorder: Secondary | ICD-10-CM

## 2022-04-25 ENCOUNTER — Ambulatory Visit
Admission: EM | Admit: 2022-04-25 | Discharge: 2022-04-25 | Disposition: A | Discharge status: Home / Self Care | Payer: No Typology Code available for payment source | Attending: Physician Assistant | Admitting: Physician Assistant

## 2022-04-25 ENCOUNTER — Ambulatory Visit (INDEPENDENT_AMBULATORY_CARE_PROVIDER_SITE_OTHER): Payer: No Typology Code available for payment source

## 2022-04-25 DIAGNOSIS — R0789 Other chest pain: Secondary | ICD-10-CM

## 2022-04-25 DIAGNOSIS — M542 Cervicalgia: Secondary | ICD-10-CM

## 2022-04-25 DIAGNOSIS — G44309 Post-traumatic headache, unspecified, not intractable: Secondary | ICD-10-CM

## 2022-04-25 DIAGNOSIS — R079 Chest pain, unspecified: Secondary | ICD-10-CM

## 2022-04-25 LAB — POCT URINE PREGNANCY: Preg Test, Ur: NEGATIVE

## 2022-04-25 MED ORDER — IBUPROFEN 800 MG PO TABS: 800.0000 mg | ORAL_TABLET | Freq: Three times a day (TID) | ORAL | 0 refills | Status: DC
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
## 2022-04-28 ENCOUNTER — Ambulatory Visit (INDEPENDENT_AMBULATORY_CARE_PROVIDER_SITE_OTHER): Payer: No Typology Code available for payment source

## 2022-04-28 ENCOUNTER — Ambulatory Visit
Admission: EM | Admit: 2022-04-28 | Discharge: 2022-04-28 | Disposition: A | Discharge status: Home / Self Care | Payer: No Typology Code available for payment source | Attending: Internal Medicine | Admitting: Internal Medicine

## 2022-04-28 DIAGNOSIS — M545 Low back pain, unspecified: Secondary | ICD-10-CM

## 2022-04-28 DIAGNOSIS — R0789 Other chest pain: Secondary | ICD-10-CM

## 2022-04-28 MED ORDER — CYCLOBENZAPRINE HCL 5 MG PO TABS: 5.0000 mg | ORAL_TABLET | Freq: Two times a day (BID) | ORAL | 0 refills | Status: DC | PRN
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
## 2022-04-28 NOTE — Discharge Instructions (Signed)
You have been prescribed a muscle relaxer to help alleviate discomfort.  You may continue ibuprofen.  Alternate ice and heat to affected area of back.  Please follow-up with orthopedist if pain persists or worsens.
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
## 2022-05-10 NOTE — Progress Notes (Signed)
Patient ID: Dana Gibbs Dana Gibbs, female    DOB: 06-Nov-1980  MRN: [id number]  CC: Annual Physical Exam  Subjective: Dana Gibbs is a 41 y.o. female who presents for annual physical exam.  Her concerns today include:  - Had two periods in one month. Painful periods. IUD placed in [place] 10 years ago.  - Was seeing an ENT specialist for dry and itching ear canals while living in [place]. Reports she doesn't make ear wax.  - Endorses sore throat, body aches, and chills without additional red flag symptoms such as but not limited to chest pain, shortness of breath, nausea, and vomiting.   There are no problems to display for this patient.    Current Outpatient Medications on File Prior to Visit  Medication Sig Dispense Refill   Ascorbic Acid (VITAMIN C) 100 MG tablet Take 100 mg by mouth daily.     cyclobenzaprine (FLEXERIL) 5 MG tablet Take 1 tablet (5 mg total) by mouth 2 (two) times daily as needed for muscle spasms. 20 tablet 0   folic acid (FOLVITE) 1 MG tablet Take 1 mg by mouth daily.     ibuprofen (ADVIL) 800 MG tablet Take 1 tablet (800 mg total) by mouth 3 (three) times daily. 21 tablet 0   Multiple Vitamins-Iron (MULTIVITAMINS WITH IRON) TABS tablet Take 1 tablet by mouth daily.     No current facility-administered medications on file prior to visit.    No Known Allergies  Social History   Socioeconomic History   Marital status: Married    Spouse name: Effie Shy   Number of children: 2   Years of education: Not on file   Highest education level: High school graduate  Occupational History   Not on file  Tobacco Use   Smoking status: Never    Passive exposure: Never   Smokeless tobacco: Never  Vaping Use   Vaping Use: Never used  Substance and Sexual Activity   Alcohol use: Not Currently   Drug use: Never   Sexual activity: Yes    Birth control/protection: I.U.D.  Other Topics Concern   Not on file  Social  History Narrative   Not on file   Social Determinants of Health   Financial Resource Strain: Not on file  Food Insecurity: Not on file  Transportation Needs: No Transportation Needs (09/23/2021)   PRAPARE - Hydrologist (Medical): No    Lack of Transportation (Non-Medical): No  Physical Activity: Not on file  Stress: Not on file  Social Connections: Not on file  Intimate Partner Violence: Not on file    Family History  Problem Relation Age of Onset   Uterine cancer Mother    Kidney disease Father    Breast cancer Neg Hx     Past Surgical History:  Procedure Laterality Date   BREAST LUMPECTOMY WITH RADIOACTIVE SEED LOCALIZATION Right 11/30/2021   Procedure: RIGHT BREAST LUMPECTOMY WITH RADIOACTIVE SEED LOCALIZATION;  Surgeon: Erroll Luna, MD;  Location: [facility];  Service: General;  Laterality: Right;   CESAREAN SECTION     x2   HEMORRHOID SURGERY      ROS: Review of Systems Negative except as stated above  PHYSICAL EXAM: BP 108/75 (BP Location: Left Arm, Patient Position: Sitting, Cuff Size: Normal)   Pulse 83   Temp 98.5 F (36.9 C)   Resp 16   Ht 5' 4.17" (1.63 m)   SpO2 98%  BMI 23.22 kg/m   Physical Exam HENT:     Head: Normocephalic and atraumatic.     Right Ear: Tympanic membrane, ear canal and external ear normal.     Left Ear: Tympanic membrane, ear canal and external ear normal.     Nose: Nose normal.     Mouth/Throat:     Mouth: Mucous membranes are moist.     Pharynx: Oropharynx is clear.  Eyes:     Extraocular Movements: Extraocular movements intact.     Conjunctiva/sclera: Conjunctivae normal.     Pupils: Pupils are equal, round, and reactive to light.  Cardiovascular:     Rate and Rhythm: Normal rate and regular rhythm.     Pulses: Normal pulses.     Heart sounds: Normal heart sounds.  Pulmonary:     Effort: Pulmonary effort is normal.     Breath sounds: Normal breath sounds.  Chest:      Comments: Patient declined.  Abdominal:     General: Bowel sounds are normal.     Palpations: Abdomen is soft.  Genitourinary:    Comments: Patient declined.  Musculoskeletal:        General: Normal range of motion.     Right shoulder: Normal.     Left shoulder: Normal.     Right upper arm: Normal.     Left upper arm: Normal.     Right elbow: Normal.     Left elbow: Normal.     Right forearm: Normal.     Left forearm: Normal.     Right wrist: Normal.     Left wrist: Normal.     Right hand: Normal.     Left hand: Normal.     Cervical back: Normal, normal range of motion and neck supple.     Thoracic back: Normal.     Lumbar back: Normal.     Right hip: Normal.     Left hip: Normal.     Right upper leg: Normal.     Left upper leg: Normal.     Right knee: Normal.     Left knee: Normal.     Right lower leg: Normal.     Left lower leg: Normal.     Right ankle: Normal.     Left ankle: Normal.     Right foot: Normal.     Left foot: Normal.  Skin:    General: Skin is warm and dry.     Capillary Refill: Capillary refill takes less than 2 seconds.  Neurological:     General: No focal deficit present.     Mental Status: She is alert and oriented to person, place, and time.  Psychiatric:        Mood and Affect: Mood normal.        Behavior: Behavior normal.    ASSESSMENT AND PLAN: 1. Annual physical exam - Counseled on 150 minutes of exercise per week as tolerated, healthy eating (including decreased daily intake of saturated fats, cholesterol, added sugars, sodium), STI prevention, and routine healthcare maintenance.  2. Screening for metabolic disorder - XHB71+IRCV to check kidney function, liver function, and electrolyte balance.  - CMP14+EGFR  3. Screening for deficiency anemia - CBC to screen for anemia. - CBC  4. Diabetes mellitus screening - Hemoglobin A1c to screen for pre-diabetes/diabetes. - Hemoglobin A1c  5. Thyroid disorder screen - TSH to check thyroid  function.  - TSH  6. Need for hepatitis C screening test - Hepatitis C antibody to screen  for hepatitis C.  - Hepatitis C Antibody  7. Encounter for screening for HIV - HIV antibody to screen for human immunodeficiency virus.  - HIV antibody (with reflex)  8. Ear canal dryness, bilateral - Referral to ENT for further evaluation and management.  - Ambulatory referral to ENT  9. History of lumpectomy of right breast - Referral to Breast Clinic for further evaluation and management.  - Ambulatory referral to Breast Clinic  10. IUD (intrauterine device) in place 67. Dysmenorrhea 12. Irregular menses - Referral to Gynecology for further evaluation and management.  - Ambulatory referral to Gynecology  13. Sore throat 14. Body aches 15. Chills (without fever) - Routine screening. - Resp panel by RT-PCR (RSV, Flu A&B, Covid) Anterior Nasal Swab  16. Language barrier - Building control surveyor. Name: Dan Europe  ID#: [id number]    Patient was given the opportunity to ask questions.  Patient verbalized understanding of the plan and was able to repeat key elements of the plan. Patient was given clear instructions to go to Emergency Department or return to medical center if symptoms don't improve, worsen, or new problems develop.The patient verbalized understanding.   Orders Placed This Encounter  Procedures   Resp panel by RT-PCR (RSV, Flu A&B, Covid) Anterior Nasal Swab   Hepatitis C Antibody   HIV antibody (with reflex)   CBC   TSH   CMP14+EGFR   Hemoglobin A1c   Ambulatory referral to ENT   Ambulatory referral to Gynecology   Ambulatory referral to Breast Clinic     Requested Prescriptions    No prescriptions requested or ordered in this encounter    Return in about 1 year (around 05/20/2023) for Physical per patient preference.  Camillia Herter, NP

## 2022-05-19 ENCOUNTER — Other Ambulatory Visit: Payer: Self-pay | Admitting: Family

## 2022-05-19 ENCOUNTER — Ambulatory Visit (INDEPENDENT_AMBULATORY_CARE_PROVIDER_SITE_OTHER): Payer: Self-pay | Admitting: Family

## 2022-05-19 ENCOUNTER — Encounter: Payer: Self-pay | Admitting: Family

## 2022-05-19 ENCOUNTER — Telehealth: Payer: Self-pay | Admitting: Family

## 2022-05-19 VITALS — BP 108/75 | HR 83 | Temp 98.5°F | Resp 16 | Ht 64.17 in

## 2022-05-19 DIAGNOSIS — H61893 Other specified disorders of external ear, bilateral: Secondary | ICD-10-CM

## 2022-05-19 DIAGNOSIS — Z130 Encounter for screening for diseases of the blood and blood-forming organs and certain disorders involving the immune mechanism: Secondary | ICD-10-CM

## 2022-05-19 DIAGNOSIS — Z1329 Encounter for screening for other suspected endocrine disorder: Secondary | ICD-10-CM

## 2022-05-19 DIAGNOSIS — R52 Pain, unspecified: Secondary | ICD-10-CM

## 2022-05-19 DIAGNOSIS — Z13228 Encounter for screening for other metabolic disorders: Secondary | ICD-10-CM

## 2022-05-19 DIAGNOSIS — Z9889 Other specified postprocedural states: Secondary | ICD-10-CM

## 2022-05-19 DIAGNOSIS — J029 Acute pharyngitis, unspecified: Secondary | ICD-10-CM

## 2022-05-19 DIAGNOSIS — Z114 Encounter for screening for human immunodeficiency virus [HIV]: Secondary | ICD-10-CM

## 2022-05-19 DIAGNOSIS — R6883 Chills (without fever): Secondary | ICD-10-CM

## 2022-05-19 DIAGNOSIS — Z1159 Encounter for screening for other viral diseases: Secondary | ICD-10-CM

## 2022-05-19 DIAGNOSIS — Z789 Other specified health status: Secondary | ICD-10-CM

## 2022-05-19 DIAGNOSIS — Z0001 Encounter for general adult medical examination with abnormal findings: Secondary | ICD-10-CM

## 2022-05-19 DIAGNOSIS — Z0000 Encounter for general adult medical examination without abnormal findings: Secondary | ICD-10-CM

## 2022-05-19 DIAGNOSIS — J020 Streptococcal pharyngitis: Secondary | ICD-10-CM

## 2022-05-19 DIAGNOSIS — N946 Dysmenorrhea, unspecified: Secondary | ICD-10-CM

## 2022-05-19 DIAGNOSIS — Z131 Encounter for screening for diabetes mellitus: Secondary | ICD-10-CM

## 2022-05-19 DIAGNOSIS — N926 Irregular menstruation, unspecified: Secondary | ICD-10-CM

## 2022-05-19 DIAGNOSIS — Z975 Presence of (intrauterine) contraceptive device: Secondary | ICD-10-CM

## 2022-05-19 LAB — POCT RAPID STREP A (OFFICE): Rapid Strep A Screen: POSITIVE — AB

## 2022-05-19 LAB — RESP PANEL BY RT-PCR (RSV, FLU A&B, COVID)  RVPGX2
Influenza A by PCR: NEGATIVE
Influenza B by PCR: NEGATIVE
Resp Syncytial Virus by PCR: NEGATIVE
SARS Coronavirus 2 by RT PCR: NEGATIVE

## 2022-05-19 MED ORDER — AMOXICILLIN 500 MG PO CAPS: 500.0000 mg | ORAL_CAPSULE | Freq: Two times a day (BID) | ORAL | 0 refills | Status: AC

## 2022-05-19 NOTE — Telephone Encounter (Signed)
Pt asking information regarding Ultrasound for breast and ENT referral (due to Allergies, dry ears, low immune system) please advise and thank you. Pt just had appt and access My chart now.

## 2022-05-19 NOTE — Patient Instructions (Signed)
Cuidados preventivos en las mujeres de 24 a 63 aos de edad Preventive Care 34-41 Years Old, Female Los cuidados preventivos hacen referencia a las opciones en cuanto al estilo de vida y a las visitas al mdico, las cuales pueden promover la salud y Musician. Las visitas de cuidado preventivo tambin se denominan exmenes de Therapist, sports. Qu puedo esperar para mi visita de cuidado preventivo? Asesoramiento Su mdico puede preguntarle acerca de: Antecedentes mdicos, incluidos los siguientes: Problemas mdicos pasados. Antecedentes mdicos familiares. Antecedentes de embarazo. Salud actual, incluido lo siguiente: Ciclo menstrual. Mtodos anticonceptivos. Su bienestar emocional. Restaurant manager, fast food y las relaciones personales. Actividad sexual y salud sexual. Kary Kos de vida, incluido lo siguiente: Consumo de alcohol, nicotina, tabaco o drogas. Acceso a armas de fuego. Hbitos de alimentacin, ejercicio y sueo. Su trabajo y [place] laboral. Uso de [place] solar. Cuestiones de seguridad, como el uso de cinturn de seguridad y casco de Secondary school teacher. Examen fsico El mdico revisar lo siguiente: IT consultant y Minor. Estos pueden usarse para calcular el IMC (ndice de masa corporal). El [facility] es una medicin que indica si tiene un peso saludable. Circunferencia de la cintura. Es Ardelia Mems medicin alrededor de Science writer. Esta medicin tambin indica si tiene un peso saludable y puede ayudar a predecir su riesgo de padecer ciertas enfermedades, como diabetes tipo 2 y presin arterial alta. Frecuencia cardaca y presin arterial. Temperatura corporal. Piel para detectar manchas anormales. Qu vacunas necesito?  Las vacunas se aplican a varias edades, segn un cronograma. El [employer] recomendar vacunas segn su edad, sus antecedentes mdicos, su estilo de vida y otros factores, como los viajes o el lugar donde trabaja. Qu pruebas necesito? Pruebas de deteccin El mdico puede recomendar  pruebas de deteccin de ciertas afecciones. Esto puede incluir: Niveles de lpidos y colesterol. Pruebas de deteccin de la diabetes. Esto se Set designer un control del azcar en la sangre (glucosa) despus de no haber comido durante un periodo de tiempo (ayuno). Examen plvico y prueba de Papanicolaou. Prueba de hepatitis B. Prueba de hepatitis C. Prueba del VIH (virus de inmunodeficiencia humana). Pruebas de infecciones de transmisin sexual (ITS), si est en riesgo. Pruebas de deteccin de cncer de pulmn. Pruebas de deteccin de cncer colorrectal. Mamografa. Hable con su mdico sobre cundo debe comenzar a Engineer, manufacturing de [place] regular. Esto depende de si tiene antecedentes familiares de cncer de mama o no. Pruebas de deteccin de cncer relacionado con las mutaciones del BRCA. Es posible que se las deba realizar si tiene antecedentes de cncer de mama, de ovario, de trompas o peritoneal. Densitometra sea. Esto se realiza para detectar osteoporosis. Hable con su mdico [employer], las opciones de tratamiento y, si corresponde, la necesidad de Optometrist ms pruebas. Siga estas instrucciones en su casa: Comida y bebida  Siga una dieta que incluya frutas y verduras frescas, cereales integrales, protenas magras y productos lcteos descremados. Tome los suplementos vitamnicos y [employer] se lo haya indicado el mdico. No beba alcohol si: Su mdico le indica no hacerlo. Est embarazada, puede estar embarazada o est tratando de [place]. Si bebe alcohol: Limite la cantidad que consume de 0 a 1 medida por da. Sepa cunta cantidad de alcohol hay en las bebidas que toma. En los [place], una medida equivale a una botella de cerveza de 12 oz (355 ml), un vaso de vino de 5 oz (148 ml) o un vaso de una bebida alcohlica de alta graduacin de 1 oz (44  ml). Estilo de vida [employer] dientes a la maana y a la noche con pasta  dental con fluoruro. Use hilo dental una vez al da. Haga al menos 30 minutos de ejercicio, 5 o ms das cada semana. No consuma ningn producto que contenga nicotina o tabaco. Estos productos incluyen cigarrillos, tabaco para Higher education careers adviser y aparatos de vapeo, como los Psychologist, sport and exercise. Si necesita ayuda para dejar de fumar, consulte al mdico. No consuma drogas. Si es sexualmente activa, practique sexo seguro. Use un condn u otra forma de proteccin para prevenir las infecciones de transmisin sexual (ITS). Si no desea quedar embarazada, use un mtodo anticonceptivo. Si busca un embarazo, realice una consulta previa al [employer] con el mdico. Tome aspirina nicamente como se lo haya indicado el mdico. Asegrese de que comprende qu cantidad y cul presentacin debe tomar. Trabaje con el mdico para averiguar si es seguro y beneficioso para usted tomar aspirina a diario. Busque maneras saludables de Scientific laboratory technician, tales como: Meditacin, yoga o Conservation officer, nature. Lleve un diario personal. Hable con una persona confiable. Pase tiempo con amigos y familiares. Minimice la exposicin a la radiacin UV para reducir el riesgo de cncer de piel. Seguridad [place] siempre el cinturn de seguridad al conducir o viajar en un vehculo. No conduzca: Si ha estado bebiendo alcohol. No viaje con un conductor que ha estado bebiendo. Si est cansada o distrada. Mientras est enviando mensajes de texto. Si ha estado usando sustancias o drogas que alteran la funcin mental. Use un casco y otros equipos de proteccin durante las actividades deportivas. Si tiene armas de fuego en su casa, asegrese de seguir todos los procedimientos de seguridad correspondientes. Busque ayuda si fue vctima de abuso fsico o abuso sexual. Cundo volver? Visite al mdico una vez al ao para una visita anual de control de bienestar. Pregntele al mdico con qu frecuencia debe realizarse un control de la vista y los  dientes. Mantenga su esquema de vacunacin al da. Esta informacin no tiene Marine scientist el consejo del mdico. Asegrese de hacerle al mdico cualquier pregunta que tenga. Document Revised: 03/18/2021 Document Reviewed: 03/18/2021 Elsevier Patient Education  [place].

## 2022-05-19 NOTE — Addendum Note (Signed)
Addended by: Margorie John on: 05/19/2022 12:52 PM   Modules accepted: Orders

## 2022-05-19 NOTE — Progress Notes (Signed)
.  Pt presents for annual physical exam   -sore throat and chills past two days -

## 2022-05-20 LAB — HIV ANTIBODY (ROUTINE TESTING W REFLEX): HIV Screen 4th Generation wRfx: NONREACTIVE

## 2022-05-20 LAB — CMP14+EGFR
ALT: 11 IU/L (ref 0–32)
AST: 12 IU/L (ref 0–40)
Albumin/Globulin Ratio: 1.4 (ref 1.2–2.2)
Albumin: 4.1 g/dL (ref 3.9–4.9)
Alkaline Phosphatase: 62 IU/L (ref 44–121)
BUN/Creatinine Ratio: 18 (ref 9–23)
BUN: 11 mg/dL (ref 6–24)
Bilirubin Total: 0.3 mg/dL (ref 0.0–1.2)
CO2: 22 mmol/L (ref 20–29)
Calcium: 9.4 mg/dL (ref 8.7–10.2)
Chloride: 103 mmol/L (ref 96–106)
Creatinine, Ser: 0.6 mg/dL (ref 0.57–1.00)
Globulin, Total: 3 g/dL (ref 1.5–4.5)
Glucose: 86 mg/dL (ref 70–99)
Potassium: 4.3 mmol/L (ref 3.5–5.2)
Sodium: 139 mmol/L (ref 134–144)
Total Protein: 7.1 g/dL (ref 6.0–8.5)
eGFR: 116 mL/min/{1.73_m2} (ref >59)

## 2022-05-20 LAB — HEMOGLOBIN A1C
Est. average glucose Bld gHb Est-mCnc: 114 mg/dL
Hgb A1c MFr Bld: 5.6 % (ref 4.8–5.6)

## 2022-05-20 LAB — CBC
Hematocrit: 34.5 % (ref 34.0–46.6)
Hemoglobin: 10.8 g/dL — ABNORMAL LOW (ref 11.1–15.9)
MCH: 27.8 pg (ref 26.6–33.0)
MCHC: 31.3 g/dL — ABNORMAL LOW (ref 31.5–35.7)
MCV: 89 fL (ref 79–97)
Platelets: 367 10*3/uL (ref 150–450)
RBC: 3.88 x10E6/uL (ref 3.77–5.28)
RDW: 14.3 % (ref 11.7–15.4)
WBC: 7.9 10*3/uL (ref 3.4–10.8)

## 2022-05-20 LAB — HEPATITIS C ANTIBODY: Hep C Virus Ab: NONREACTIVE

## 2022-05-20 LAB — TSH: TSH: 0.978 u[IU]/mL (ref 0.450–4.500)

## 2022-05-26 ENCOUNTER — Encounter: Payer: No Typology Code available for payment source | Admitting: Advanced Practice Midwife

## 2022-05-31 ENCOUNTER — Telehealth: Payer: Self-pay | Admitting: Hematology and Oncology

## 2022-05-31 NOTE — Telephone Encounter (Signed)
Scheduled appt per 9/7 referral using interpreter services. Pt is aware of appt date and time. Pt is aware to arrive 15 mins prior to appt time and to bring and updated insurance card. Pt is aware of appt location.

## 2022-06-22 ENCOUNTER — Inpatient Hospital Stay
Payer: No Typology Code available for payment source | Attending: Hematology and Oncology | Admitting: Hematology and Oncology

## 2022-06-22 ENCOUNTER — Other Ambulatory Visit: Payer: Self-pay

## 2022-06-22 DIAGNOSIS — Z8049 Family history of malignant neoplasm of other genital organs: Secondary | ICD-10-CM | POA: Insufficient documentation

## 2022-06-22 DIAGNOSIS — Z8542 Personal history of malignant neoplasm of other parts of uterus: Secondary | ICD-10-CM | POA: Insufficient documentation

## 2022-06-22 DIAGNOSIS — N6489 Other specified disorders of breast: Secondary | ICD-10-CM | POA: Insufficient documentation

## 2022-06-22 DIAGNOSIS — Z809 Family history of malignant neoplasm, unspecified: Secondary | ICD-10-CM

## 2022-06-22 NOTE — Progress Notes (Signed)
[facility] CONSULT NOTE  Patient Care Team: Rema Fendt, NP as PCP - General (Nurse Practitioner)  CHIEF COMPLAINTS/PURPOSE OF CONSULTATION:    HISTORY OF PRESENTING ILLNESS:  Dana Gibbs 41 y.o. female is here because of recent diagnosis of complex sclerosing lesion in the breast.  She had a palpable abnormality which was investigated and she underwent lumpectomy that revealed a complex sclerosing lesion.  There was no evidence of DCIS or atypical hyperplasia.  She does not have any family history of breast cancer.  There is some history of uterine cancer. I discussed with the patient using an interpreter  I reviewed her records extensively and collaborated the history with the patient.  MEDICAL HISTORY:  Past Medical History:  Diagnosis Date   Anemia    GERD (gastroesophageal reflux disease)    PONV (postoperative nausea and vomiting)     SURGICAL HISTORY: Past Surgical History:  Procedure Laterality Date   BREAST LUMPECTOMY WITH RADIOACTIVE SEED LOCALIZATION Right 11/30/2021   Procedure: RIGHT BREAST LUMPECTOMY WITH RADIOACTIVE SEED LOCALIZATION;  Surgeon: Harriette Bouillon, MD;  Location: [facility];  Service: General;  Laterality: Right;   CESAREAN SECTION     x2   HEMORRHOID SURGERY      SOCIAL HISTORY: Social History   Socioeconomic History   Marital status: Married    Spouse name: Norva Pavlov   Number of children: 2   Years of education: Not on file   Highest education level: High school graduate  Occupational History   Not on file  Tobacco Use   Smoking status: Never    Passive exposure: Never   Smokeless tobacco: Never  Vaping Use   Vaping Use: Never used  Substance and Sexual Activity   Alcohol use: Not Currently   Drug use: Never   Sexual activity: Yes    Birth control/protection: I.U.D.  Other Topics Concern   Not on file  Social History Narrative   Not on file   Social Determinants of Health    Financial Resource Strain: Not on file  Food Insecurity: Not on file  Transportation Needs: No Transportation Needs (09/23/2021)   PRAPARE - Administrator, Civil Service (Medical): No    Lack of Transportation (Non-Medical): No  Physical Activity: Not on file  Stress: Not on file  Social Connections: Not on file  Intimate Partner Violence: Not on file    FAMILY HISTORY: Family History  Problem Relation Age of Onset   Uterine cancer Mother    Kidney disease Father    Breast cancer Neg Hx     ALLERGIES:  has No Known Allergies.  MEDICATIONS:  Current Outpatient Medications  Medication Sig Dispense Refill   Ascorbic Acid (VITAMIN C) 100 MG tablet Take 100 mg by mouth daily.     cyclobenzaprine (FLEXERIL) 5 MG tablet Take 1 tablet (5 mg total) by mouth 2 (two) times daily as needed for muscle spasms. 20 tablet 0   folic acid (FOLVITE) 1 MG tablet Take 1 mg by mouth daily.     ibuprofen (ADVIL) 800 MG tablet Take 1 tablet (800 mg total) by mouth 3 (three) times daily. 21 tablet 0   Multiple Vitamins-Iron (MULTIVITAMINS WITH IRON) TABS tablet Take 1 tablet by mouth daily.     No current facility-administered medications for this visit.    REVIEW OF SYSTEMS:   Constitutional: Denies fevers, chills or abnormal night sweats   All other systems were reviewed with the  patient and are negative.  PHYSICAL EXAMINATION: ECOG PERFORMANCE STATUS: 0 - Asymptomatic  Vitals:   06/22/22 1349  BP: 117/78  Pulse: 71  Resp: 16  Temp: 98.4 F (36.9 C)  SpO2: 100%   Filed Weights   06/22/22 1349  Weight: 138 lb (62.6 kg)    GENERAL:alert, no distress and comfortable     LABORATORY DATA:  I have reviewed the data as listed Lab Results  Component Value Date   WBC 7.9 05/19/2022   HGB 10.8 (L) 05/19/2022   HCT 34.5 05/19/2022   MCV 89 05/19/2022   PLT 367 05/19/2022   Lab Results  Component Value Date   NA 139 05/19/2022   K 4.3 05/19/2022   CL 103  05/19/2022   CO2 22 05/19/2022    RADIOGRAPHIC STUDIES: I have personally reviewed the radiological reports and agreed with the findings in the report.  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  No malignancy  Pathology counseling: Radial scars, also called complex sclerosing lesions, are a pathologic diagnosis, usually discovered incidentally when a breast mass or radiologic abnormality is removed or biopsied. Occasionally, radial scars are large enough to be detected on mammography as suspicious spiculated masses, which cannot be reliably differentiated from spiculated carcinoma by imaging alone. radial scars are excised because most case series show that 8 to 17 percent of surgical specimens at subsequent excision are positive for malignancy or DCIS.  Breast cancer risk: Tyrer-Cuzick: 10-year risk: 1.5% (average risk 1.8%) Lifetime risk: 9.5% (average risk 10.7%)  Breast cancer surveillance: Based upon the above results she does not need any aggressive surveillance strategies.  She can continue with her annual mammograms for surveillance.  We discussed in general about exercise and increasing fruits and vegetables as preventative for breast cancer.  Return to clinic on an as-needed basis.    All questions were answered. The patient knows to call the clinic with any problems, questions or concerns.    Harriette Ohara, MD 06/22/22

## 2022-06-22 NOTE — Assessment & Plan Note (Addendum)
11/30/2021: Right lumpectomy: Complex sclerosing lesion.  No malignancy  Pathology counseling: Radial scars, also called complex sclerosing lesions, are a pathologic diagnosis, usually discovered incidentally when a breast mass or radiologic abnormality is removed or biopsied. Occasionally, radial scars are large enough to be detected on mammography as suspicious spiculated masses, which cannot be reliably differentiated from spiculated carcinoma by imaging alone. radial scars are excised because most case series show that 8 to 17 percent of surgical specimens at subsequent excision are positive for malignancy or DCIS.  Breast cancer risk: Tyrer-Cuzick: 10-year risk: 1.5% (average risk 1.8%) Lifetime risk: 9.5% (average risk 10.7%)  Breast cancer surveillance: Based upon the above results she does not need any aggressive surveillance strategies.  She can continue with her annual mammograms for surveillance.  We discussed in general about exercise and increasing fruits and vegetables as preventative for breast cancer.  Return to clinic on an as-needed basis.

## 2022-11-30 ENCOUNTER — Ambulatory Visit
Admission: EM | Admit: 2022-11-30 | Discharge: 2022-11-30 | Disposition: A | Discharge status: Home / Self Care | Payer: Self-pay | Attending: Urgent Care | Admitting: Urgent Care

## 2022-11-30 DIAGNOSIS — N760 Acute vaginitis: Secondary | ICD-10-CM | POA: Insufficient documentation

## 2022-11-30 MED ORDER — FLUCONAZOLE 150 MG PO TABS: 150.0000 mg | ORAL_TABLET | ORAL | 0 refills | Status: DC

## 2022-11-30 NOTE — ED Provider Notes (Signed)
[facility]  Note:  This document was prepared using Systems analyst and may include unintentional dictation errors.  MRN: [id number] DOB: [birth year]  Subjective:   Dana Gibbs is a 42 y.o. female presenting for persistent vaginal itching, burning, irritation. Was seen and treated with her OB/GYN, had 1 tablet of fluconazole and 5 days of Metrogel. Toward the end of the Metrogel course she felt a dramatic worsening of her symptoms but did not have fluconazole anymore. Denies fever, n/v, abdominal pain, pelvic pain, rashes, dysuria, urinary frequency, hematuria.  No concern for pregnancy per patient.   No current facility-administered medications for this encounter.  Current Outpatient Medications:    Ascorbic Acid (VITAMIN C) 100 MG tablet, Take 100 mg by mouth daily., Disp: , Rfl:    cyclobenzaprine (FLEXERIL) 5 MG tablet, Take 1 tablet (5 mg total) by mouth 2 (two) times daily as needed for muscle spasms., Disp: 20 tablet, Rfl: 0   folic acid (FOLVITE) 1 MG tablet, Take 1 mg by mouth daily., Disp: , Rfl:    ibuprofen (ADVIL) 800 MG tablet, Take 1 tablet (800 mg total) by mouth 3 (three) times daily., Disp: 21 tablet, Rfl: 0   Multiple Vitamins-Iron (MULTIVITAMINS WITH IRON) TABS tablet, Take 1 tablet by mouth daily., Disp: , Rfl:    No Known Allergies  Past Medical History:  Diagnosis Date   Anemia    GERD (gastroesophageal reflux disease)    PONV (postoperative nausea and vomiting)      Past Surgical History:  Procedure Laterality Date   BREAST LUMPECTOMY WITH RADIOACTIVE SEED LOCALIZATION Right 11/30/2021   Procedure: RIGHT BREAST LUMPECTOMY WITH RADIOACTIVE SEED LOCALIZATION;  Surgeon: Erroll Luna, MD;  Location: [facility];  Service: General;  Laterality: Right;   CESAREAN SECTION     x2   HEMORRHOID SURGERY      Family History  Problem Relation Age of Onset   Uterine cancer Mother     Kidney disease Father    Breast cancer Neg Hx     Social History   Tobacco Use   Smoking status: Never    Passive exposure: Never   Smokeless tobacco: Never  Vaping Use   Vaping Use: Never used  Substance Use Topics   Alcohol use: Not Currently   Drug use: Never    ROS   Objective:   Vitals: BP 109/72 (BP Location: Left Arm)   Pulse 72   Temp 98.6 F (37 C) (Oral)   Resp 16   LMP 10/31/2022 (Exact Date)   SpO2 96%   Physical Exam Constitutional:      General: She is not in acute distress.    Appearance: Normal appearance. She is well-developed. She is not ill-appearing, toxic-appearing or diaphoretic.  HENT:     Head: Normocephalic and atraumatic.     Nose: Nose normal.     Mouth/Throat:     Mouth: Mucous membranes are moist.  Eyes:     General: No scleral icterus.       Right eye: No discharge.        Left eye: No discharge.     Extraocular Movements: Extraocular movements intact.  Cardiovascular:     Rate and Rhythm: Normal rate.  Pulmonary:     Effort: Pulmonary effort is normal.  Skin:    General: Skin is warm and dry.  Neurological:     General: No focal deficit present.     Mental  Status: She is alert and oriented to person, place, and time.  Psychiatric:        Mood and Affect: Mood normal.        Behavior: Behavior normal.     Assessment and Plan :   PDMP not reviewed this encounter.  1. Acute vaginitis     Will recheck for BV, yeast vaginitis and treat empirically for yeast vaginitis only. Labs pending. Counseled patient on potential for adverse effects with medications prescribed/recommended today, ER and return-to-clinic precautions discussed, patient verbalized understanding.    Jaynee Eagles, [place] 11/30/22 1316

## 2022-11-30 NOTE — ED Triage Notes (Signed)
Pt states via interpretor she went to the GYN last week to have her IUD removed is on Metrogel but states she is having a lot of vaginal  irritation.

## 2022-12-01 LAB — CERVICOVAGINAL ANCILLARY ONLY
Bacterial Vaginitis (gardnerella): NEGATIVE
Candida Glabrata: NEGATIVE
Candida Vaginitis: POSITIVE — AB
Comment: NEGATIVE
Comment: NEGATIVE
Comment: NEGATIVE

## 2023-01-12 ENCOUNTER — Ambulatory Visit (HOSPITAL_COMMUNITY)
Admission: EM | Admit: 2023-01-12 | Discharge: 2023-01-12 | Disposition: A | Discharge status: Home / Self Care | Payer: Self-pay | Attending: Internal Medicine | Admitting: Internal Medicine

## 2023-01-12 ENCOUNTER — Encounter (HOSPITAL_COMMUNITY): Payer: Self-pay

## 2023-01-12 DIAGNOSIS — Z3A01 Less than 8 weeks gestation of pregnancy: Secondary | ICD-10-CM

## 2023-01-12 LAB — POCT URINE PREGNANCY: Preg Test, Ur: POSITIVE — AB

## 2023-01-12 MED ORDER — PRENATAL COMPLETE 14-0.4 MG PO TABS: 1.0000 | ORAL_TABLET | Freq: Every day | ORAL | 0 refills | Status: AC

## 2023-01-12 NOTE — Discharge Instructions (Signed)
You are pregnant, congratulations. Please start taking a prenatal vitamin once daily. Read the attached handouts. Call OB tomorrow to schedule an appointment Must avoid NSAIDs like ibuprofen while pregnant

## 2023-01-12 NOTE — ED Provider Notes (Signed)
[facility]    CSN: [id number] Arrival date & time: 01/12/23  1932      History   Chief Complaint Chief Complaint  Patient presents with   Abdominal Pain    HPI Dana Gibbs is a 42 y.o. female.   42 year old female presents today primarily to confirm pregnancy.  States her last menstrual period was roughly 19 March.  She took a pregnancy test on April 16 at home which was positive.  She has been pregnant 2 times in the past, her last pregnancy was 9 years ago.  She states over the past 2 weeks she has been having a pelvic pressure and a lower back pain including fatigue and chills.  She denies any vaginal discharge bleeding, or dysuria.  She denies fevers.  She does not recall if she had the symptoms with her prior pregnancies.  She denies abdominal pain, nausea, or vomiting.  She is not currently taking a prenatal.  She [place] and is looking for an OB.   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    Past Surgical History:  Procedure Laterality Date   BREAST LUMPECTOMY WITH RADIOACTIVE SEED LOCALIZATION Right 11/30/2021   Procedure: RIGHT BREAST LUMPECTOMY WITH RADIOACTIVE SEED LOCALIZATION;  Surgeon: Harriette Bouillon, MD;  Location: [facility];  Service: General;  Laterality: Right;   CESAREAN SECTION     x2   HEMORRHOID SURGERY      OB History     Gravida  1   Para      Term      Preterm      AB      Living         SAB      IAB      Ectopic      Multiple      Live Births               Home Medications    Prior to Admission medications   Medication Sig Start Date End Date Taking? Authorizing Provider  Prenatal Vit-Fe Fumarate-FA (PRENATAL  COMPLETE) 14-0.4 MG TABS Take 1 tablet by mouth daily. 01/12/23  Yes Deontray Hunnicutt, Jodelle Gross, PA    Family History Family History  Problem Relation Age of Onset   Uterine cancer Mother    Kidney disease Father    Breast cancer Neg Hx     Social History Social History   Tobacco Use   Smoking status: Never    Passive exposure: Never   Smokeless tobacco: Never  Vaping Use   Vaping Use: Never used  Substance Use Topics   Alcohol use: Not Currently   Drug use: Never     Allergies   Patient has no known allergies.   Review of Systems Review of Systems  Gastrointestinal:  Positive for abdominal pain.  As per HPI   Physical Exam Triage Vital Signs ED Triage Vitals  Enc Vitals Group     BP 01/12/23 2004 121/81     Pulse Rate 01/12/23 2004 71     Resp 01/12/23 2004 18     Temp 01/12/23 2004 98.6 F (37 C)     Temp Source 01/12/23 2004 Oral  SpO2 01/12/23 2004 99 %     Weight --      Height --      Head Circumference --      Peak Flow --      Pain Score 01/12/23 2007 8     Pain Loc --      Pain Edu? --      Excl. in GC? --    No data found.  Updated Vital Signs BP 121/81 (BP Location: Left Arm)   Pulse 71   Temp 98.6 F (37 C) (Oral)   Resp 18   LMP 11/29/2022   SpO2 99%   Visual Acuity Right Eye Distance:   Left Eye Distance:   Bilateral Distance:    Right Eye Near:   Left Eye Near:    Bilateral Near:     Physical Exam Vitals and nursing note reviewed. Exam conducted with a chaperone present.  Constitutional:      General: She is not in acute distress.    Appearance: She is well-developed. She is not ill-appearing or toxic-appearing.  HENT:     Head: Normocephalic and atraumatic.  Eyes:     General: No scleral icterus.    Extraocular Movements: Extraocular movements intact.     Conjunctiva/sclera: Conjunctivae normal.  Cardiovascular:     Rate and Rhythm: Normal rate.  Pulmonary:     Effort: Pulmonary effort is normal. No respiratory distress.   Abdominal:     Palpations: Abdomen is soft.     Tenderness: There is no abdominal tenderness.  Musculoskeletal:        General: No swelling.     Cervical back: Neck supple.  Skin:    General: Skin is warm and dry.     Capillary Refill: Capillary refill takes less than 2 seconds.     Coloration: Skin is not pale.     Findings: No rash.  Neurological:     General: No focal deficit present.     Mental Status: She is alert and oriented to person, place, and time.  Psychiatric:        Mood and Affect: Mood normal.      UC Treatments / Results  Labs (all labs ordered are listed, but only abnormal results are displayed) Labs Reviewed  POCT URINE PREGNANCY - Abnormal; Notable for the following components:      Result Value   Preg Test, Ur Positive (*)    All other components within normal limits    EKG   Radiology No results found.  Procedures Procedures (including critical care time)  Medications Ordered in UC Medications - No data to display  Initial Impression / Assessment and Plan / UC Course  I have reviewed the triage vital signs and the nursing notes.  Pertinent labs & imaging results that were available during my care of the patient were reviewed by me and considered in my medical decision making (see chart for details).     Pregnancy -patient currently estimated to be at 7 weeks of pregnancy.  She denies any abdominal pain or bleeding.  It sounds as though she is having normal early signs of pregnancy.  Patient and husband are very excited.  I did recommend she start a prenatal at this time, handouts provided to patient, and OB recommendations for patient to call and schedule tomorrow morning.  ER precautions discussed   Final Clinical Impressions(s) / UC Diagnoses   Final diagnoses:  Less than [redacted] weeks gestation of pregnancy  Discharge Instructions      You are pregnant, congratulations. Please start taking a prenatal vitamin once daily. Read the  attached handouts. Call OB tomorrow to schedule an appointment Must avoid NSAIDs like ibuprofen while pregnant     ED Prescriptions     Medication Sig Dispense Auth. Provider   Prenatal Vit-Fe Fumarate-FA (PRENATAL COMPLETE) 14-0.4 MG TABS Take 1 tablet by mouth daily. 120 tablet Jermond Burkemper L, [place]      PDMP not reviewed this encounter.   Maretta Bees, [place] 01/12/23 2051

## 2023-01-12 NOTE — ED Triage Notes (Signed)
Pt states had a positive home pregnancy test on 12/27/22. States having lower abdominal pain and fatigue x2 wks ago. Denies vaginal bleeding.

## 2023-01-20 ENCOUNTER — Inpatient Hospital Stay (HOSPITAL_COMMUNITY): Payer: Self-pay

## 2023-01-20 ENCOUNTER — Encounter (HOSPITAL_COMMUNITY): Payer: Self-pay | Admitting: *Deleted

## 2023-01-20 ENCOUNTER — Inpatient Hospital Stay (HOSPITAL_COMMUNITY)
Admission: AD | Admit: 2023-01-20 | Discharge: 2023-01-20 | Disposition: A | Discharge status: Home / Self Care | Payer: Self-pay | Attending: Obstetrics & Gynecology | Admitting: Obstetrics & Gynecology

## 2023-01-20 DIAGNOSIS — Z711 Person with feared health complaint in whom no diagnosis is made: Secondary | ICD-10-CM

## 2023-01-20 DIAGNOSIS — O3481 Maternal care for other abnormalities of pelvic organs, first trimester: Secondary | ICD-10-CM | POA: Insufficient documentation

## 2023-01-20 DIAGNOSIS — Z603 Acculturation difficulty: Secondary | ICD-10-CM | POA: Insufficient documentation

## 2023-01-20 DIAGNOSIS — O26891 Other specified pregnancy related conditions, first trimester: Secondary | ICD-10-CM | POA: Insufficient documentation

## 2023-01-20 DIAGNOSIS — O26851 Spotting complicating pregnancy, first trimester: Secondary | ICD-10-CM | POA: Insufficient documentation

## 2023-01-20 DIAGNOSIS — M549 Dorsalgia, unspecified: Secondary | ICD-10-CM | POA: Insufficient documentation

## 2023-01-20 DIAGNOSIS — N939 Abnormal uterine and vaginal bleeding, unspecified: Secondary | ICD-10-CM

## 2023-01-20 DIAGNOSIS — Z3A01 Less than 8 weeks gestation of pregnancy: Secondary | ICD-10-CM | POA: Insufficient documentation

## 2023-01-20 DIAGNOSIS — N83201 Unspecified ovarian cyst, right side: Secondary | ICD-10-CM | POA: Insufficient documentation

## 2023-01-20 DIAGNOSIS — Z758 Other problems related to medical facilities and other health care: Secondary | ICD-10-CM

## 2023-01-20 LAB — URINALYSIS, ROUTINE W REFLEX MICROSCOPIC
Bilirubin Urine: NEGATIVE
Glucose, UA: NEGATIVE mg/dL
Ketones, ur: NEGATIVE mg/dL
Leukocytes,Ua: NEGATIVE
Nitrite: NEGATIVE
Protein, ur: NEGATIVE mg/dL
Specific Gravity, Urine: 1.005 (ref 1.005–1.030)
pH: 5 (ref 5.0–8.0)

## 2023-01-20 LAB — WET PREP, GENITAL
Clue Cells Wet Prep HPF POC: NONE SEEN
Sperm: NONE SEEN
Trich, Wet Prep: NONE SEEN
WBC, Wet Prep HPF POC: <10 (ref <10)
Yeast Wet Prep HPF POC: NONE SEEN

## 2023-01-20 LAB — CBC
HCT: 31 % — ABNORMAL LOW (ref 36.0–46.0)
Hemoglobin: 10.7 g/dL — ABNORMAL LOW (ref 12.0–15.0)
MCH: 29.5 pg (ref 26.0–34.0)
MCHC: 34.5 g/dL (ref 30.0–36.0)
MCV: 85.4 fL (ref 80.0–100.0)
Platelets: 336 10*3/uL (ref 150–400)
RBC: 3.63 MIL/uL — ABNORMAL LOW (ref 3.87–5.11)
RDW: 15 % (ref 11.5–15.5)
WBC: 9.3 10*3/uL (ref 4.0–10.5)
nRBC: 0 % (ref 0.0–0.2)

## 2023-01-20 LAB — GC/CHLAMYDIA PROBE AMP (~~LOC~~) NOT AT ARMC
Chlamydia: NEGATIVE
Comment: NEGATIVE
Comment: NORMAL
Neisseria Gonorrhea: NEGATIVE

## 2023-01-20 LAB — HCG, QUANTITATIVE, PREGNANCY: hCG, Beta Chain, Quant, S: 158184 m[IU]/mL — ABNORMAL HIGH (ref <5)

## 2023-01-20 LAB — ABO/RH: ABO/RH(D): A POS

## 2023-01-20 NOTE — MAU Note (Addendum)
.  Dana Gibbs is a 42 y.o. at [redacted]w[redacted]d here in MAU reporting bleeding like a period for an hour. Bleeding is not heavy-states is like the beginning of a period.  Having some lower back pain. Occ abdominal pain but not now. No recent intercourse.  LMP: 11/29/22 Onset of complaint: 1hour ago Pain score: 8 Vitals:   01/20/23 0224 01/20/23 0227  BP:  110/69  Pulse: 64   Resp: 18   Temp: 98 F (36.7 C)   SpO2: 100%      FHT:n/a Lab orders placed from triage:  u/a

## 2023-01-20 NOTE — Progress Notes (Signed)
WRitten and verbal d/c instructions given by Dorathy Daft CNM using video interpreter. Pt voiced understanding

## 2023-01-20 NOTE — MAU Provider Note (Signed)
History     CSN: [id number]  Arrival date and time: 01/20/23 4098   Event Date/Time   First Provider Initiated Contact with Patient 01/20/23 [phone]      Chief Complaint  Patient presents with   Back Pain   Vaginal Bleeding   Dana Gibbs , a  42 y.o. [phone] at [redacted]w[redacted]d presents to MAU with complaints of brown spotting and vaginal discharge since last night. She states that about 1 hour prior to arrival to MAU she noticed with wiping some brown vaginal bleeding. She denies bright red vaginal bleeding or passing clots. She states she wore a pad to MAU but was clean upon arrival. She denies abnormal vaginal discharge, or urinary symptoms. She also reports on-going low back pain. She states that she has has this back pain since her first pregnancy and it "always occurs during pregnancy." She currently rates pain a 8/10 and denies attempting to relieve symptoms. She reports last intercourse was >1 weeks ago.          OB History     Gravida  3   Para  2   Term  2   Preterm      AB      Living  2      SAB      IAB      Ectopic      Multiple      Live Births  2           Past Medical History:  Diagnosis Date   Anemia    GERD (gastroesophageal reflux disease)    PONV (postoperative nausea and vomiting)     Past Surgical History:  Procedure Laterality Date   BREAST LUMPECTOMY WITH RADIOACTIVE SEED LOCALIZATION Right 11/30/2021   Procedure: RIGHT BREAST LUMPECTOMY WITH RADIOACTIVE SEED LOCALIZATION;  Surgeon: Harriette Bouillon, MD;  Location: [facility];  Service: General;  Laterality: Right;   CESAREAN SECTION     x2   HEMORRHOID SURGERY      Family History  Problem Relation Age of Onset   Uterine cancer Mother    Kidney disease Father    Breast cancer Neg Hx     Social History   Tobacco Use   Smoking status: Never    Passive exposure: Never   Smokeless tobacco: Never  Vaping Use   Vaping Use: Never used   Substance Use Topics   Alcohol use: Not Currently   Drug use: Never    Allergies: No Known Allergies  Medications Prior to Admission  Medication Sig Dispense Refill Last Dose   Prenatal Vit-Fe Fumarate-FA (PRENATAL COMPLETE) 14-0.4 MG TABS Take 1 tablet by mouth daily. 120 tablet 0     Review of Systems  Constitutional:  Negative for chills, fatigue and fever.  Eyes:  Negative for pain and visual disturbance.  Respiratory:  Negative for apnea, shortness of breath and wheezing.   Cardiovascular:  Negative for chest pain and palpitations.  Gastrointestinal:  Negative for abdominal pain, constipation, diarrhea, nausea and vomiting.  Genitourinary:  Positive for vaginal bleeding and vaginal discharge. Negative for difficulty urinating, dysuria, pelvic pain and vaginal pain.  Musculoskeletal:  Positive for back pain.  Neurological:  Negative for seizures, weakness and headaches.  Psychiatric/Behavioral:  Negative for suicidal ideas.    Physical Exam   Blood pressure 110/69, pulse 64, temperature 98 F (36.7 C), resp. rate 18, height 5' (1.524 m), weight 65.3 kg, last menstrual period 11/29/2022, SpO2 100 %.  Physical Exam Vitals and nursing note reviewed.  Constitutional:      General: She is not in acute distress.    Appearance: Normal appearance.  HENT:     Head: Normocephalic.  Pulmonary:     Effort: Pulmonary effort is normal.  Musculoskeletal:     Cervical back: Normal range of motion.  Skin:    General: Skin is warm and dry.  Neurological:     Mental Status: She is alert and oriented to person, place, and time.  Psychiatric:        Mood and Affect: Mood normal.     MAU Course  Procedures Orders Placed This Encounter  Procedures   Wet prep, genital   US OB Comp Less 14 Wks   Urinalysis, Routine w reflex microscopic -Urine, Clean Catch   CBC   hCG, quantitative, pregnancy   Diet NPO time specified   ABO/Rh   Discharge patient   Results for orders placed  or performed during the hospital encounter of 01/20/23 (from the past 24 hour(s))  Wet prep, genital     Status: None   Collection Time: 01/20/23  2:34 AM   Specimen: PATH Cytology Cervicovaginal Ancillary Only  Result Value Ref Range   Yeast Wet Prep HPF POC NONE SEEN NONE SEEN   Trich, Wet Prep NONE SEEN NONE SEEN   Clue Cells Wet Prep HPF POC NONE SEEN NONE SEEN   WBC, Wet Prep HPF POC <10 <10   Sperm NONE SEEN   CBC     Status: Abnormal   Collection Time: 01/20/23  2:51 AM  Result Value Ref Range   WBC 9.3 4.0 - 10.5 K/uL   RBC 3.63 (L) 3.87 - 5.11 MIL/uL   Hemoglobin 10.7 (L) 12.0 - 15.0 g/dL   HCT 16.1 (L) 09.6 - 04.5 %   MCV 85.4 80.0 - 100.0 fL   MCH 29.5 26.0 - 34.0 pg   MCHC 34.5 30.0 - 36.0 g/dL   RDW 40.9 81.1 - 91.4 %   Platelets 336 150 - 400 K/uL   nRBC 0.0 0.0 - 0.2 %  ABO/Rh     Status: None   Collection Time: 01/20/23  2:51 AM  Result Value Ref Range   ABO/RH(D) A POS    No rh immune globuloin      NOT A RH IMMUNE GLOBULIN CANDIDATE, PT RH POSITIVE Performed at [facility], [address], [place], [place] [address]   hCG, quantitative, pregnancy     Status: Abnormal   Collection Time: 01/20/23  2:51 AM  Result Value Ref Range   hCG, Beta Chain, Quant, S 158,184 (H) <5 mIU/mL   US OB Comp Less 14 Wks  Result Date: 01/20/2023 CLINICAL DATA:  Vaginal bleeding EXAM: OBSTETRIC <14 WK ULTRASOUND TECHNIQUE: Transabdominal ultrasound was performed for evaluation of the gestation as well as the maternal uterus and adnexal regions. COMPARISON:  None Available. FINDINGS: Intrauterine gestational sac: Single Yolk sac:  Visualized. Embryo:  Visualized. Cardiac Activity: Visualized. Heart Rate: 138 bpm CRL:   12.8 mm   7 w 4 d                  [place] EDC: 09/04/2023 Subchorionic hemorrhage:  None visualized. Maternal uterus/adnexae: 5.5 cm cyst in the right ovary. No convincing internal septation or nodule on the provided images. Recommend follow-up US in 3-6 months.  Note: This recommendation does not apply to premenarchal patients or to those with increased risk (genetic, family history,  elevated tumor markers or other high-risk factors) of ovarian cancer. Reference: Radiology 2019 Nov; 293(2):359-371. IMPRESSION: 1. Single living intrauterine pregnancy measuring 7 weeks 4 days. 2. 5.5 cm cyst in the right ovary. Recommend follow-up US in 3-6 months. Note: This recommendation does not apply to premenarchal patients or to those with increased risk (genetic, family history, elevated tumor markers or other high-risk factors) of ovarian cancer. Reference: Radiology 2019 Nov; 293(2):359-371. Electronically Signed   By: Tiburcio Pea M.D.   On: 01/20/2023 04:08    MDM - UA pending upon discharge - Wet prep normal  - CBC unremarkable  - Quant noted to be >158,000 - [place] results revealed a single living IUP measuring approximately [redacted]w[redacted]d.  - plan for discharge.   Assessment and Plan   1. Vaginal bleeding   2. [redacted] weeks gestation of pregnancy   3. Physically well but worried   4. Language barrier    - Reviewed that vaginal spotting can be a normal discomfort of pregnancy.  - Discussed worsening signs and return precautions.  - Recommended to seek prenatal care at the facility of her choice.  - Patient discharged home in stable condition and may return to MAU as needed.    Claudette Head 01/20/2023, 4:27 AM

## 2023-01-20 NOTE — MAU Note (Signed)
Pt urinated just after Triage and has not been able to go again for u/a

## 2023-03-22 ENCOUNTER — Ambulatory Visit (INDEPENDENT_AMBULATORY_CARE_PROVIDER_SITE_OTHER): Payer: Self-pay | Admitting: Student

## 2023-03-22 ENCOUNTER — Encounter: Payer: Self-pay | Admitting: Student

## 2023-03-22 ENCOUNTER — Ambulatory Visit (INDEPENDENT_AMBULATORY_CARE_PROVIDER_SITE_OTHER): Payer: Self-pay | Admitting: Licensed Clinical Social Worker

## 2023-03-22 VITALS — BP 102/66 | HR 78 | Wt 148.8 lb

## 2023-03-22 DIAGNOSIS — Z1339 Encounter for screening examination for other mental health and behavioral disorders: Secondary | ICD-10-CM

## 2023-03-22 DIAGNOSIS — Z9889 Other specified postprocedural states: Secondary | ICD-10-CM

## 2023-03-22 DIAGNOSIS — Z3480 Encounter for supervision of other normal pregnancy, unspecified trimester: Secondary | ICD-10-CM | POA: Insufficient documentation

## 2023-03-22 DIAGNOSIS — Z758 Other problems related to medical facilities and other health care: Secondary | ICD-10-CM

## 2023-03-22 DIAGNOSIS — Z603 Acculturation difficulty: Secondary | ICD-10-CM

## 2023-03-22 DIAGNOSIS — O0990 Supervision of high risk pregnancy, unspecified, unspecified trimester: Secondary | ICD-10-CM | POA: Insufficient documentation

## 2023-03-22 DIAGNOSIS — Z3A16 16 weeks gestation of pregnancy: Secondary | ICD-10-CM

## 2023-03-22 DIAGNOSIS — Z98891 History of uterine scar from previous surgery: Secondary | ICD-10-CM

## 2023-03-22 DIAGNOSIS — O09522 Supervision of elderly multigravida, second trimester: Secondary | ICD-10-CM

## 2023-03-22 MED ORDER — ASPIRIN 81 MG PO TBEC
81.0000 mg | DELAYED_RELEASE_TABLET | Freq: Every day | ORAL | 2 refills | Status: DC
Start: 2023-03-22 — End: 2023-08-31

## 2023-03-22 NOTE — Progress Notes (Signed)
Pt presents for NOB visit. Pt c/o pain on the left side,  nausea and back pain.  Last PAP 09/2021 No Hx of abnormal PAP Pt interested in TOLAC.

## 2023-03-22 NOTE — Progress Notes (Signed)
History:   Dana Gibbs is a 42 y.o. G3P2002 at [redacted]w[redacted]d by LMP being seen today for her first obstetrical visit.  Her obstetrical history is significant for advanced maternal age and CSX2 ( patient for transverse presentation and large babies both times (8.8lbs and <9lbs) . Patient does intend to breast feed and bottle feed. Pregnancy history fully reviewed.  Patient reports backache and headache. Tolerable symptoms. Is comfortable with taking tylenol as needed.       HISTORY: OB History  Gravida Para Term Preterm AB Living  3 2 2  0 0 2  SAB IAB Ectopic Multiple Live Births  0 0 0 0 2    # Outcome Date GA Lbr Len/2nd Weight Sex Type Anes PTL Lv  3 Current           2 Term 03/04/13     CS-LTranv   LIV  1 Term 09/22/06     CS-LTranv   LIV    Last pap smear was done January 2023 and was normal. Denies history of abnormal pap smears.  Past Medical History:  Diagnosis Date   Anemia    GERD (gastroesophageal reflux disease)    PONV (postoperative nausea and vomiting)    Past Surgical History:  Procedure Laterality Date   BREAST LUMPECTOMY WITH RADIOACTIVE SEED LOCALIZATION Right 11/30/2021   Procedure: RIGHT BREAST LUMPECTOMY WITH RADIOACTIVE SEED LOCALIZATION;  Surgeon: Harriette Bouillon, MD;  Location: [facility];  Service: General;  Laterality: Right;   CESAREAN SECTION     x2   HEMORRHOID SURGERY     Family History  Problem Relation Age of Onset   Uterine cancer Mother    Kidney disease Father    Breast cancer Neg Hx    Social History   Tobacco Use   Smoking status: Never    Passive exposure: Never   Smokeless tobacco: Never  Vaping Use   Vaping status: Never Used  Substance Use Topics   Alcohol use: Not Currently   Drug use: Never   No Known Allergies Current Outpatient Medications on File Prior to Visit  Medication Sig Dispense Refill   folic acid (FOLVITE) 400 MCG tablet Take 400 mcg by mouth daily.     Prenatal Vit-Fe  Fumarate-FA (PRENATAL COMPLETE) 14-0.4 MG TABS Take 1 tablet by mouth daily. 120 tablet 0   No current facility-administered medications on file prior to visit.   Indications for ASA therapy (per uptodate) One of the following: Previous pregnancy with preeclampsia, especially early onset and with an adverse outcome No Multifetal gestation No Chronic hypertension No Type 1 or 2 diabetes mellitus No Chronic kidney disease No Autoimmune disease (antiphospholipid syndrome, systemic lupus erythematosus) No  Two or more of the following: Nulliparity No Obesity (body mass index >30 kg/m2) No Family history of preeclampsia in mother or sister No Age ?35 years Yes Sociodemographic characteristics (African American race, low socioeconomic level) Yes Personal risk factors (eg, previous pregnancy with low birth weight or small for gestational age infant, previous adverse pregnancy outcome [eg, stillbirth], interval >10 years between pregnancies) Yes  Indications for early GDM screening  First-degree relative with diabetes No BMI >30kg/m2 No Age > 35 Yes Previous birth of an infant weighing ?4000 g Yes Gestational diabetes mellitus in a previous pregnancy No Glycated hemoglobin ?5.7 percent (39 mmol/mol), impaired glucose tolerance, or impaired fasting glucose on previous testing No High-risk race/ethnicity (eg, African American, Latino, Native American, [place] American, Pacific Islander) Yes Previous stillbirth  of unknown cause No Maternal birthweight > 9 lbs No History of cardiovascular disease No Hypertension or on therapy for hypertension No High-density lipoprotein cholesterol level <35 mg/dL (1.61 mmol/L) and/or a triglyceride level >250 mg/dL (0.96 mmol/L) No Polycystic ovary syndrome No Physical inactivity No Other clinical condition associated with insulin resistance (eg, severe obesity, acanthosis nigricans) No Current use of glucocorticoids No   Early screening tests: FBS, A1C,  Random CBG, glucose challenge   Review of Systems Pertinent items noted in HPI and remainder of comprehensive ROS otherwise negative. Physical Exam:   Vitals:   03/22/23 1119  BP: 102/66  Pulse: 78  Weight: 148 lb 12.8 oz (67.5 kg)   Fetal Heart Rate (bpm): 151   System: General: well-developed, well-nourished female in no acute distress   Breasts:  normal appearance   Skin: normal coloration and turgor, no rashes   Neurologic: oriented, normal, negative, normal mood   Extremities: normal strength, tone, and muscle mass, ROM of all joints is normal   HEENT PERRLA, extraocular movement intact and sclera clear, anicteric   Mouth/Teeth mucous membranes moist, pharynx normal without lesions and dental hygiene good   Neck supple and no masses   Cardiovascular: regular rate and rhythm   Respiratory:  no respiratory distress, normal breath sounds   Abdomen: soft, non-tender; bowel sounds normal; no masses,  no organomegaly      Assessment:    Pregnancy: [id number] Patient Active Problem List   Diagnosis Date Noted   Supervision of other normal pregnancy, antepartum 03/22/2023   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 Supervision of other normal pregnancy, antepartum - CBC/D/Plt+RPR+Rh+ABO+RubIgG... - HgB A1c - Comp Met (CMET) - [place] Basic Panel - PANORAMA PRENATAL TEST - aspirin EC 81 MG tablet; Take 1 tablet (81 mg total) by mouth daily.  Dispense: 60 tablet; Refill: 2 - [place] MFM OB DETAIL +14 WK; Future  2. AMA (advanced maternal age) multigravida 35+, second trimester - Genetic testing requested - ASA therapy recommended  3. Language barrier - Interpreter used throughout visit  4. History of 2 cesarean sections - Vertical incision in [place], plan for repeat this pregnancy   5. History of lumpectomy of right breast - 2023, should not impact breastfeeding   6. [redacted] weeks gestation of pregnancy    Initial labs drawn. Continue  prenatal vitamins. Genetic Screening discussed, First trimester screen, Quad screen, and NIPS: requested. Ultrasound discussed; fetal anatomic survey: ordered. Problem list reviewed and updated. The nature of [place] [facility] with multiple MDs and other Advanced Practice Providers was explained to patient; also emphasized that residents, students are part of our team. Routine obstetric precautions reviewed. Return in about 4 weeks (around 04/19/2023) for [facility], IN-PERSON; [email or web address]    Corlis Hove, NP    Faculty Practice Center for [employer], [facility]

## 2023-03-22 NOTE — Patient Instructions (Signed)

## 2023-03-23 LAB — COMPREHENSIVE METABOLIC PANEL
ALT: 12 IU/L (ref 0–32)
AST: 13 IU/L (ref 0–40)
Albumin: 3.7 g/dL — ABNORMAL LOW (ref 3.9–4.9)
Alkaline Phosphatase: 56 IU/L (ref 44–121)
BUN/Creatinine Ratio: 16 (ref 9–23)
BUN: 6 mg/dL (ref 6–24)
Bilirubin Total: <0.2 mg/dL (ref 0.0–1.2)
CO2: 19 mmol/L — ABNORMAL LOW (ref 20–29)
Calcium: 9.3 mg/dL (ref 8.7–10.2)
Chloride: 104 mmol/L (ref 96–106)
Creatinine, Ser: 0.38 mg/dL — ABNORMAL LOW (ref 0.57–1.00)
Globulin, Total: 2.7 g/dL (ref 1.5–4.5)
Glucose: 76 mg/dL (ref 70–99)
Potassium: 4.1 mmol/L (ref 3.5–5.2)
Sodium: 136 mmol/L (ref 134–144)
Total Protein: 6.4 g/dL (ref 6.0–8.5)
eGFR: 129 mL/min/{1.73_m2} (ref >59)

## 2023-03-23 LAB — CBC/D/PLT+RPR+RH+ABO+RUBIGG...
Antibody Screen: NEGATIVE
Basophils Absolute: 0.1 10*3/uL (ref 0.0–0.2)
Basos: 1 %
EOS (ABSOLUTE): 0.1 10*3/uL (ref 0.0–0.4)
Eos: 2 %
HCV Ab: NONREACTIVE
HIV Screen 4th Generation wRfx: NONREACTIVE
Hematocrit: 32.3 % — ABNORMAL LOW (ref 34.0–46.6)
Hemoglobin: 10.6 g/dL — ABNORMAL LOW (ref 11.1–15.9)
Hepatitis B Surface Ag: NEGATIVE
Immature Grans (Abs): 0.1 10*3/uL (ref 0.0–0.1)
Immature Granulocytes: 1 %
Lymphocytes Absolute: 1.2 10*3/uL (ref 0.7–3.1)
Lymphs: 21 %
MCH: 29.1 pg (ref 26.6–33.0)
MCHC: 32.8 g/dL (ref 31.5–35.7)
MCV: 89 fL (ref 79–97)
Monocytes Absolute: 0.5 10*3/uL (ref 0.1–0.9)
Monocytes: 8 %
Neutrophils Absolute: 4 10*3/uL (ref 1.4–7.0)
Neutrophils: 67 %
Platelets: 326 10*3/uL (ref 150–450)
RBC: 3.64 x10E6/uL — ABNORMAL LOW (ref 3.77–5.28)
RDW: 14.5 % (ref 11.7–15.4)
RPR Ser Ql: NONREACTIVE
Rh Factor: POSITIVE
Rubella Antibodies, IGG: 1.32 index (ref >0.99)
WBC: 5.9 10*3/uL (ref 3.4–10.8)

## 2023-03-23 LAB — HEMOGLOBIN A1C
Est. average glucose Bld gHb Est-mCnc: 108 mg/dL
Hgb A1c MFr Bld: 5.4 % (ref 4.8–5.6)

## 2023-03-23 LAB — HCV INTERPRETATION

## 2023-03-24 NOTE — BH Specialist Note (Signed)
Integrated Behavioral Health Initial In-Person Visit  MRN: [id number] Name: Dana Gibbs  Number of Integrated Behavioral Health Clinician visits: 1 Session Start time:   930am Session End time: 945am Total time in minutes: 15 mins in person at [facility]   Types of Service: General Behavioral Integrated Care (BHI)  Interpretor:No. Interpretor Name and Language: none   Warm Hand Off Completed.        Subjective: Dana Hesselbach del Transito mulani debro is a 42 y.o. female accompanied by n/a Patient was referred by Florene Route NP for new ob introuction. Patient reports the following symptoms/concerns: new ob introduction Duration of problem: n/a; Severity of problem: mild  Objective: Mood: good and Affect: Appropriate Risk of harm to self or others: No plan to harm self or others  Life Context: Family and Social: Lives with family  School/Work: n/a Self-Care: n/a Life Changes: new pregnancy  Patient and/or Family's Strengths/Protective Factors: Concrete supports in place (healthy food, safe environments, etc.)  Goals Addressed: Patient will: Prioritize rest Take prenatal vitamins   Collaborate with medical providers   Progress towards Goals: Ongoing  Interventions: Interventions utilized: Preventative Services/Health Promotion  Standardized Assessments completed: PHQ 9  Patient and/or Family Response: Ms. Jayme Cloud reports no immediate concerns. Ms. Jayme Cloud reports family is supportive.    Assessment: Patient completed new ob introduction.   Patient may benefit from integrated behavioral health.  Plan: Follow up with behavioral health clinician on : as needed  Behavioral recommendations: na  Referral(s): Integrated [employer] (In Clinic) "From scale of 1-10, how likely are you to follow plan?":    Gwyndolyn Saxon, LCSW

## 2023-03-28 LAB — PANORAMA PRENATAL TEST FULL PANEL:PANORAMA TEST PLUS 5 ADDITIONAL MICRODELETIONS: FETAL FRACTION: 12.7

## 2023-03-31 LAB — HORIZON CUSTOM: REPORT SUMMARY: NEGATIVE

## 2023-04-20 ENCOUNTER — Ambulatory Visit (INDEPENDENT_AMBULATORY_CARE_PROVIDER_SITE_OTHER): Payer: Self-pay | Admitting: Obstetrics and Gynecology

## 2023-04-20 VITALS — BP 99/65 | HR 80 | Wt 154.0 lb

## 2023-04-20 DIAGNOSIS — Z3A20 20 weeks gestation of pregnancy: Secondary | ICD-10-CM

## 2023-04-20 DIAGNOSIS — Z3480 Encounter for supervision of other normal pregnancy, unspecified trimester: Secondary | ICD-10-CM

## 2023-04-20 DIAGNOSIS — O09529 Supervision of elderly multigravida, unspecified trimester: Secondary | ICD-10-CM | POA: Insufficient documentation

## 2023-04-20 DIAGNOSIS — O09522 Supervision of elderly multigravida, second trimester: Secondary | ICD-10-CM

## 2023-04-20 NOTE — Progress Notes (Signed)
   PRENATAL VISIT NOTE  Subjective:  Dana Gibbs is a 42 y.o. G3P2002 at [redacted]w[redacted]d being seen today for ongoing prenatal care.  She is currently monitored for the following issues for this low-risk pregnancy and has Complex sclerosing lesion of right breast; Supervision of other normal pregnancy, antepartum; and Advanced maternal age in multigravida on their problem list.  Patient doing well with no acute concerns today. She reports no complaints.  Contractions: Not present. Vag. Bleeding: None.  Movement: Present. Denies leaking of fluid.   The following portions of the patient's history were reviewed and updated as appropriate: allergies, current medications, past family history, past medical history, past social history, past surgical history and problem list. Problem list updated.  Objective:   Vitals:   04/20/23 1126  BP: 99/65  Pulse: 80  Weight: 154 lb (69.9 kg)    Fetal Status: Fetal Heart Rate (bpm): 147 Fundal Height: 20 cm Movement: Present     General:  Alert, oriented and cooperative. Patient is in no acute distress.  Skin: Skin is warm and dry. No rash noted.   Cardiovascular: Normal heart rate noted  Respiratory: Normal respiratory effort, no problems with respiration noted  Abdomen: Soft, gravid, appropriate for gestational age.  Pain/Pressure: Present     Pelvic: Cervical exam deferred        Extremities: Normal range of motion.  Edema: None  Mental Status:  Normal mood and affect. Normal behavior. Normal judgment and thought content.   Assessment and Plan:  Pregnancy: G3P2002 at [redacted]w[redacted]d  1. [redacted] weeks gestation of pregnancy   2. Supervision of other normal pregnancy, antepartum Continue routine prenatal  3. Multigravida of advanced maternal age in second trimester Pt has anatomy scan on 04/21/23 Horizon and Cindy Hazy were normal  Preterm labor symptoms and general obstetric precautions including but not limited to vaginal bleeding,  contractions, leaking of fluid and fetal movement were reviewed in detail with the patient.  Please refer to After Visit Summary for other counseling recommendations.   Return in about 4 weeks (around 05/18/2023) for ROB, in person.   Mariel Aloe, MD Faculty Attending Center for [facility]

## 2023-04-20 NOTE — Progress Notes (Signed)
ROB, Pt wants to discuss her Lab results.

## 2023-05-18 ENCOUNTER — Encounter: Payer: Self-pay | Admitting: Obstetrics & Gynecology

## 2023-05-23 ENCOUNTER — Ambulatory Visit (INDEPENDENT_AMBULATORY_CARE_PROVIDER_SITE_OTHER): Payer: Self-pay | Admitting: Advanced Practice Midwife

## 2023-05-23 ENCOUNTER — Other Ambulatory Visit (HOSPITAL_COMMUNITY)
Admission: RE | Admit: 2023-05-23 | Discharge: 2023-05-23 | Disposition: A | Discharge status: Home / Self Care | Payer: Self-pay | Source: Ambulatory Visit | Attending: Obstetrics & Gynecology | Admitting: Obstetrics & Gynecology

## 2023-05-23 VITALS — BP 100/66 | HR 80 | Wt 160.4 lb

## 2023-05-23 DIAGNOSIS — Z3A25 25 weeks gestation of pregnancy: Secondary | ICD-10-CM

## 2023-05-23 DIAGNOSIS — Z603 Acculturation difficulty: Secondary | ICD-10-CM

## 2023-05-23 DIAGNOSIS — O09522 Supervision of elderly multigravida, second trimester: Secondary | ICD-10-CM

## 2023-05-23 DIAGNOSIS — O0990 Supervision of high risk pregnancy, unspecified, unspecified trimester: Secondary | ICD-10-CM

## 2023-05-23 DIAGNOSIS — N898 Other specified noninflammatory disorders of vagina: Secondary | ICD-10-CM

## 2023-05-23 DIAGNOSIS — Z758 Other problems related to medical facilities and other health care: Secondary | ICD-10-CM

## 2023-05-23 MED ORDER — TERCONAZOLE 0.4 % VA CREA
1.0000 | TOPICAL_CREAM | Freq: Every day | VAGINAL | 0 refills | Status: DC
Start: 2023-05-23 — End: 2023-07-02

## 2023-05-23 NOTE — Progress Notes (Addendum)
   PRENATAL VISIT NOTE  Subjective:  Dana Gibbs is a 42 y.o. G3P2002 at [redacted]w[redacted]d being seen today for ongoing prenatal care.  She is currently monitored for the following issues for this high-risk pregnancy and has Complex sclerosing lesion of right breast; Supervision of high risk pregnancy, antepartum; and Advanced maternal age in multigravida on their problem list.  Patient reports  vaginal irritation/itching x 1 week .  Contractions: Irritability. Vag. Bleeding: None.  Movement: Present. Denies leaking of fluid.   The following portions of the patient's history were reviewed and updated as appropriate: allergies, current medications, past family history, past medical history, past social history, past surgical history and problem list.   Objective:   Vitals:   05/23/23 0900  BP: 100/66  Pulse: 80  Weight: 160 lb 6.4 oz (72.8 kg)    Fetal Status: Fetal Heart Rate (bpm): 146 Fundal Height: 26 cm Movement: Present     General:  Alert, oriented and cooperative. Patient is in no acute distress.  Skin: Skin is warm and dry. No rash noted.   Cardiovascular: Normal heart rate noted  Respiratory: Normal respiratory effort, no problems with respiration noted  Abdomen: Soft, gravid, appropriate for gestational age.  Pain/Pressure: Present     Pelvic: Cervical exam deferred        Extremities: Normal range of motion.  Edema: None  Mental Status: Normal mood and affect. Normal behavior. Normal judgment and thought content.   Assessment and Plan:  Pregnancy: G3P2002 at [redacted]w[redacted]d 1. Supervision of high risk pregnancy, antepartum --Anticipatory guidance about next visits/weeks of pregnancy given.  --Check [place] results  2. Multigravida of advanced maternal age in second trimester --Recent [facility] [place] was wnl --Panorama LR female --Growth Korea at 41 and 36 weeks and antenatal testing at 36 weeks recommended  3. Language barrier --spanish video interpreter used for all  communication  4. [redacted] weeks gestation of pregnancy  5. Vaginal irritation --Self swab collected today, symptoms c/w yeast  - Cervicovaginal ancillary only( [place]) - terconazole (TERAZOL 7) 0.4 % vaginal cream; Place 1 applicator vaginally at bedtime.  Dispense: 45 g; Refill: 0   Preterm labor symptoms and general obstetric precautions including but not limited to vaginal bleeding, contractions, leaking of fluid and fetal movement were reviewed in detail with the patient. Please refer to After Visit Summary for other counseling recommendations.   Return in about 4 weeks (around 06/20/2023) for GTT at next visit.  Future Appointments  Date Time Provider Department Center  06/15/2023  9:15 AM [facility] None  06/15/2023  9:35 AM Raelyn Mora, CNM [facility] None    Sharen Counter, CNM

## 2023-05-23 NOTE — Progress Notes (Signed)
Pt. Presents for ROB. Pt has pain, itching and irritation.

## 2023-05-23 NOTE — Addendum Note (Signed)
Addended by: Sharen Counter A on: 05/23/2023 10:41 AM   Modules accepted: Orders

## 2023-05-24 LAB — CERVICOVAGINAL ANCILLARY ONLY
Bacterial Vaginitis (gardnerella): NEGATIVE
Candida Glabrata: NEGATIVE
Candida Vaginitis: POSITIVE — AB
Chlamydia: NEGATIVE
Comment: NEGATIVE
Comment: NEGATIVE
Comment: NEGATIVE
Comment: NEGATIVE
Comment: NEGATIVE
Comment: NORMAL
Neisseria Gonorrhea: NEGATIVE
Trichomonas: NEGATIVE

## 2023-05-30 ENCOUNTER — Telehealth: Payer: Self-pay

## 2023-05-30 NOTE — Telephone Encounter (Signed)
-----   Message from Sharen Counter sent at 05/23/2023 10:34 AM EDT ----- Regarding: normal Korea results This spanish speaking patient asked about her recent [facility] [place] results.  I did not see the results at the time of the visit but they are in the media tab now. Can you call her with the interpreter and let her know the Korea was normal? The mouth/lips are not well viewed, but they do not recommend another Korea at this time. We may order other Korea due to her age, but this [place] is normal. Thank you!

## 2023-05-30 NOTE — Telephone Encounter (Signed)
Called patient to inform of ultrasound results via Spanish interpreter.

## 2023-06-15 ENCOUNTER — Other Ambulatory Visit: Payer: Self-pay

## 2023-06-15 ENCOUNTER — Ambulatory Visit (INDEPENDENT_AMBULATORY_CARE_PROVIDER_SITE_OTHER): Payer: Self-pay | Admitting: Obstetrics and Gynecology

## 2023-06-15 ENCOUNTER — Encounter: Payer: Self-pay | Admitting: Obstetrics and Gynecology

## 2023-06-15 VITALS — BP 99/68 | HR 90 | Wt 160.6 lb

## 2023-06-15 DIAGNOSIS — Z603 Acculturation difficulty: Secondary | ICD-10-CM

## 2023-06-15 DIAGNOSIS — O0990 Supervision of high risk pregnancy, unspecified, unspecified trimester: Secondary | ICD-10-CM

## 2023-06-15 DIAGNOSIS — Z758 Other problems related to medical facilities and other health care: Secondary | ICD-10-CM

## 2023-06-15 DIAGNOSIS — O09522 Supervision of elderly multigravida, second trimester: Secondary | ICD-10-CM

## 2023-06-15 DIAGNOSIS — Z3A28 28 weeks gestation of pregnancy: Secondary | ICD-10-CM

## 2023-06-15 NOTE — Progress Notes (Signed)
Pt presents for ROB visit. Pt c/o pain in the leg. Informed of Tdap and flu at health department.

## 2023-06-22 NOTE — Progress Notes (Addendum)
HIGH-RISK PREGNANCY OFFICE VISIT Patient name: Dana Gibbs MRN [id number]  Date of birth: 02-Jan-1981 Chief Complaint:   Routine Prenatal Visit  History of Present Illness:   Dana Gibbs is a 42 y.o. [id number] female at [redacted]w[redacted]d with an Estimated Date of Delivery: 09/05/23 being seen today for ongoing management of a high-risk pregnancy complicated by AMA 42yo and previous C/S x 2  Today she reports  she ate breakfast and is unable to do her GTT today and she has been having leg pain . Contractions: Not present. Vag. Bleeding: None.  Movement: Present. denies leaking of fluid.  Review of Systems:   Pertinent items are noted in HPI Denies abnormal vaginal discharge w/ itching/odor/irritation, headaches, visual changes, shortness of breath, chest pain, abdominal pain, severe nausea/vomiting, or problems with urination or bowel movements unless otherwise stated above. Pertinent History Reviewed:  Reviewed past medical,surgical, social, obstetrical and family history.  Reviewed problem list, medications and allergies. Physical Assessment:   Vitals:   06/15/23 0956  BP: 99/68  Pulse: 90  Weight: 160 lb 9.6 oz (72.8 kg)  Body mass index is 31.37 kg/m.           Physical Examination:   General appearance: alert, well appearing, and in no distress, oriented to person, place, and time, and normal appearing weight  Mental status: alert, oriented to person, place, and time, normal mood, behavior, speech, dress, motor activity, and thought processes  Skin: warm & dry   Extremities: Edema: None    Cardiovascular: normal heart rate noted  Respiratory: normal respiratory effort, no distress  Abdomen: gravid, soft, non-tender  Pelvic: Cervical exam deferred         Fetal Status: Fetal Heart Rate (bpm): 140 Fundal Height: 30 cm Movement: Present    Fetal Surveillance Testing today: none   No results found for this or any previous visit (from the  past 24 hour(s)).  Assessment & Plan:  1) High-risk pregnancy G3P2002 at [redacted]w[redacted]d with an Estimated Date of Delivery: 09/05/23   2) Supervision of high risk pregnancy, antepartum - Anticipatory guidance for 2 hr GTT - advised to fast after midnight without anything to eat or drink (except for water), will have fasting blood drawn, drink the glucola drink (flavor choices: orange or fruit punch), have a visit with a provider during the first hour of testing, wait in the lab waiting room to have blood drawn at 1 hour and then 2 hours after finishing glucola drink. - Patient expressed desire to have BTL with Rpt C/S. Advised she would have to speak with one of our MDs about the scheduling process and payment process for [facility]opt-[employer]Mom program participants - Per patient's husband, the last U/S didn't have complete facial views. He wanted to know when they would have the U/s repeated. - F/U U/S to be rescheduled  3) Multigravida of advanced maternal age in second trimester  4) Language barrier - [employer] Video Spanish Interpreter, [name] [phone] used for entire visit   5) [redacted] weeks gestation of pregnancy   Meds: No orders of the defined types were placed in this encounter.  Labs/procedures today: none   Reviewed: Preterm labor symptoms and general obstetric precautions including but not limited to vaginal bleeding, contractions, leaking of fluid and fetal movement were reviewed in detail with the patient.  All questions were answered. Has home bp cuff.  Check bp weekly, let us know if >140/90.   Follow-up: Return in about  2 weeks (around 06/29/2023) for Return OB visit.  Orders Placed This Encounter  Procedures   HIV antibody (with reflex)   RPR   CBC   Glucose Tolerance, 2 Hours w/1 Hour   Raelyn Mora MSN, CNM 06/22/2023 10:57 AM

## 2023-06-29 ENCOUNTER — Other Ambulatory Visit: Payer: Self-pay

## 2023-06-29 ENCOUNTER — Ambulatory Visit (INDEPENDENT_AMBULATORY_CARE_PROVIDER_SITE_OTHER): Payer: Self-pay | Admitting: Obstetrics and Gynecology

## 2023-06-29 VITALS — BP 104/71 | HR 67 | Wt 162.0 lb

## 2023-06-29 DIAGNOSIS — O0990 Supervision of high risk pregnancy, unspecified, unspecified trimester: Secondary | ICD-10-CM

## 2023-06-29 DIAGNOSIS — Z3A30 30 weeks gestation of pregnancy: Secondary | ICD-10-CM

## 2023-06-29 DIAGNOSIS — Z98891 History of uterine scar from previous surgery: Secondary | ICD-10-CM

## 2023-06-29 DIAGNOSIS — Z302 Encounter for sterilization: Secondary | ICD-10-CM

## 2023-06-29 DIAGNOSIS — Z758 Other problems related to medical facilities and other health care: Secondary | ICD-10-CM

## 2023-06-29 DIAGNOSIS — O09523 Supervision of elderly multigravida, third trimester: Secondary | ICD-10-CM

## 2023-06-29 DIAGNOSIS — Z603 Acculturation difficulty: Secondary | ICD-10-CM

## 2023-06-30 LAB — CBC
Hematocrit: 28.9 % — ABNORMAL LOW (ref 34.0–46.6)
Hemoglobin: 9.4 g/dL — ABNORMAL LOW (ref 11.1–15.9)
MCH: 28.9 pg (ref 26.6–33.0)
MCHC: 32.5 g/dL (ref 31.5–35.7)
MCV: 89 fL (ref 79–97)
Platelets: 289 10*3/uL (ref 150–450)
RBC: 3.25 x10E6/uL — ABNORMAL LOW (ref 3.77–5.28)
RDW: 13.1 % (ref 11.7–15.4)
WBC: 6.4 10*3/uL (ref 3.4–10.8)

## 2023-06-30 LAB — GLUCOSE TOLERANCE, 2 HOURS W/ 1HR
Glucose, 1 hour: 133 mg/dL (ref 70–179)
Glucose, 2 hour: 109 mg/dL (ref 70–152)
Glucose, Fasting: 82 mg/dL (ref 70–91)

## 2023-06-30 LAB — RPR: RPR Ser Ql: NONREACTIVE

## 2023-06-30 LAB — HIV ANTIBODY (ROUTINE TESTING W REFLEX): HIV Screen 4th Generation wRfx: NONREACTIVE

## 2023-07-02 DIAGNOSIS — Z98891 History of uterine scar from previous surgery: Secondary | ICD-10-CM | POA: Insufficient documentation

## 2023-07-02 DIAGNOSIS — Z302 Encounter for sterilization: Secondary | ICD-10-CM | POA: Insufficient documentation

## 2023-07-02 DIAGNOSIS — Z758 Other problems related to medical facilities and other health care: Secondary | ICD-10-CM | POA: Insufficient documentation

## 2023-07-02 MED ORDER — FERROUS SULFATE 325 (65 FE) MG PO TABS: 325.0000 mg | ORAL_TABLET | ORAL | 1 refills | Status: AC

## 2023-07-02 NOTE — Addendum Note (Signed)
Addended by: Harvie Bridge on: 07/02/2023 01:15 PM   Modules accepted: Orders

## 2023-07-02 NOTE — Progress Notes (Signed)
   PRENATAL VISIT NOTE  Subjective:  Dana Gibbs is a 42 y.o. G3P2002 at [redacted]w[redacted]d being seen today for ongoing prenatal care.  She is currently monitored for the following issues for this high-risk pregnancy and has Complex sclerosing lesion of right breast; Supervision of high risk pregnancy, antepartum; Advanced maternal age in multigravida; History of 2 cesarean sections; and Request for sterilization on their problem list.  Patient reports  doing well overall .  Contractions: Not present. Vag. Bleeding: None.  Movement: Present. Denies leaking of fluid.   The following portions of the patient's history were reviewed and updated as appropriate: allergies, current medications, past family history, past medical history, past social history, past surgical history and problem list.   Objective:   Vitals:   06/29/23 0837  BP: 104/71  Pulse: 67  Weight: 162 lb (73.5 kg)    Fetal Status: Fetal Heart Rate (bpm): 135   Movement: Present     General:  Alert, oriented and cooperative. Patient is in no acute distress.  Skin: Skin is warm and dry. No rash noted.   Cardiovascular: Normal heart rate noted  Respiratory: Normal respiratory effort, no problems with respiration noted  Abdomen: Soft, gravid, appropriate for gestational age.  Pain/Pressure: Present      Assessment and Plan:  Pregnancy: G3P2002 at [redacted]w[redacted]d 1. Supervision of high risk pregnancy, antepartum 2. [redacted] weeks gestation of pregnancy Letter provided for patient to obtain tdap & flu through [facility] ([facility] [employer] Mom) - Glucose Tolerance, 2 Hours w/1 Hour - CBC - HIV Antibody (routine testing w rflx) - RPR  3. Multigravida of advanced maternal age in third trimester ldASA NIPS LR Growth [place] ordered through [facility] Antenatal testing to start at 36 weeks Delivery at 39 weeks (rpt CS planned)  4. History of 2 cesarean sections Discussed risks/benefits of scheduled repeat vs TOLAC. Pt opted for repeat  cesarean delivery Schedule CS next appt  5. Request for sterilization - She desires permanent sterilization. Discussed alternatives including LARC options and vasectomy. Reviewed that this is a permanent procedure. She declines these options.  - Discussed surgery of salpingectomy at time of cesarean delivery. Reviewed that adding salpingectomy only marginally changes risk of CS if at all. Discussed risks of surgery include but are not limited to: bleeding, infection, injury to surrounding organs/tissues (i.e. bowel/bladder/ureters), need for additional procedures, wound complications, hospital re-admission, and conversion to open surgery, VTE. We reviewed risk of contraceptive failure and risk of regret.  - Pt in process of applying for medicaid. MA-31 signed today. If her application is not approved, we discussed need to pre pay for tubal  6. Language barrier Patient interviewed & counseled with in person spanish interpreter  Please refer to After Visit Summary for other counseling recommendations.   Return in about 2 weeks (around 07/13/2023) for return OB at 32 weeks.  Future Appointments  Date Time Provider Department Center  07/13/2023  1:30 PM Warden Fillers, MD [facility] None  07/27/2023  1:30 PM Warden Fillers, MD [facility] None  08/08/2023  1:30 PM Warden Fillers, MD [facility] None  08/15/2023  1:30 PM Joanne Gavel, MD [facility] None  08/22/2023  1:30 PM Adam Phenix, MD [facility] None  08/29/2023  1:30 PM Joanne Gavel, MD [facility] None  09/04/2023  1:30 PM Constant, Gigi Gin, MD [facility] None    Lennart Pall, MD

## 2023-07-13 ENCOUNTER — Ambulatory Visit (INDEPENDENT_AMBULATORY_CARE_PROVIDER_SITE_OTHER): Payer: Self-pay | Admitting: Obstetrics and Gynecology

## 2023-07-13 VITALS — BP 109/70 | HR 88 | Wt 163.0 lb

## 2023-07-13 DIAGNOSIS — Z603 Acculturation difficulty: Secondary | ICD-10-CM

## 2023-07-13 DIAGNOSIS — O99613 Diseases of the digestive system complicating pregnancy, third trimester: Secondary | ICD-10-CM

## 2023-07-13 DIAGNOSIS — Z758 Other problems related to medical facilities and other health care: Secondary | ICD-10-CM

## 2023-07-13 DIAGNOSIS — Z3A32 32 weeks gestation of pregnancy: Secondary | ICD-10-CM

## 2023-07-13 DIAGNOSIS — K5900 Constipation, unspecified: Secondary | ICD-10-CM

## 2023-07-13 DIAGNOSIS — O0990 Supervision of high risk pregnancy, unspecified, unspecified trimester: Secondary | ICD-10-CM

## 2023-07-13 DIAGNOSIS — Z302 Encounter for sterilization: Secondary | ICD-10-CM

## 2023-07-13 DIAGNOSIS — Z98891 History of uterine scar from previous surgery: Secondary | ICD-10-CM

## 2023-07-13 DIAGNOSIS — O09523 Supervision of elderly multigravida, third trimester: Secondary | ICD-10-CM

## 2023-07-13 MED ORDER — DOCUSATE SODIUM 100 MG PO CAPS
100.0000 mg | ORAL_CAPSULE | Freq: Two times a day (BID) | ORAL | 2 refills | Status: AC | PRN
Start: 2023-07-13 — End: ?

## 2023-07-13 NOTE — Progress Notes (Signed)
Pt is worried about weight gain.  Pt has been having some constipation and now hemorrhoid.  Pt has added Fiber supplement and diet changes. Pt has stopped Iron supplement due to constipation - has changed diet.

## 2023-07-13 NOTE — Patient Instructions (Signed)
Senokot , over the counter laxative

## 2023-07-13 NOTE — Progress Notes (Signed)
   PRENATAL VISIT NOTE  Subjective:  Dana Gibbs is a 42 y.o. G3P2002 at [redacted]w[redacted]d being seen today for ongoing prenatal care.  She is currently monitored for the following issues for this high-risk pregnancy and has Complex sclerosing lesion of right breast; Supervision of high risk pregnancy, antepartum; Advanced maternal age in multigravida; History of 2 cesarean sections; Request for sterilization; and Language barrier on their problem list.  Patient doing well with no acute concerns today. She reports  increased constipation and hemorrhoids .  Contractions: Not present. Vag. Bleeding: None.  Movement: Present. Denies leaking of fluid.   The following portions of the patient's history were reviewed and updated as appropriate: allergies, current medications, past family history, past medical history, past social history, past surgical history and problem list. Problem list updated.  Objective:   Vitals:   07/13/23 1334  BP: 109/70  Pulse: 88  Weight: 163 lb (73.9 kg)    Fetal Status: Fetal Heart Rate (bpm): 140 Fundal Height: 32 cm Movement: Present     General:  Alert, oriented and cooperative. Patient is in no acute distress.  Skin: Skin is warm and dry. No rash noted.   Cardiovascular: Normal heart rate noted  Respiratory: Normal respiratory effort, no problems with respiration noted  Abdomen: Soft, gravid, appropriate for gestational age.  Pain/Pressure: Absent     Pelvic: Cervical exam deferred        Extremities: Normal range of motion.     Mental Status:  Normal mood and affect. Normal behavior. Normal judgment and thought content.   Assessment and Plan:  Pregnancy: G3P2002 at [redacted]w[redacted]d  1. Supervision of high risk pregnancy, antepartum Continue routine prenatal care  2. [redacted] weeks gestation of pregnancy   3. Multigravida of advanced maternal age in third trimester   4. History of 2 cesarean sections Pt to get repeat c/s with BTL, Can schedule c  section at next visit  5. Language barrier Live interpreter present  6. Request for sterilization BTL form previously signed  7. Constipation during pregnancy in third trimester Pt already drinking enough water Will add colace and pt advised about OTC senokot - docusate sodium (COLACE) 100 MG capsule; Take 1 capsule (100 mg total) by mouth 2 (two) times daily as needed.  Dispense: 30 capsule; Refill: 2  Preterm labor symptoms and general obstetric precautions including but not limited to vaginal bleeding, contractions, leaking of fluid and fetal movement were reviewed in detail with the patient.  Please refer to After Visit Summary for other counseling recommendations.   Return in about 2 weeks (around 07/27/2023) for [facility], in person.   Mariel Aloe, MD Faculty Attending Center for [facility]

## 2023-07-27 ENCOUNTER — Ambulatory Visit (INDEPENDENT_AMBULATORY_CARE_PROVIDER_SITE_OTHER): Payer: Self-pay | Admitting: Obstetrics and Gynecology

## 2023-07-27 VITALS — BP 116/72 | HR 79 | Wt 164.4 lb

## 2023-07-27 DIAGNOSIS — Z302 Encounter for sterilization: Secondary | ICD-10-CM

## 2023-07-27 DIAGNOSIS — Z603 Acculturation difficulty: Secondary | ICD-10-CM

## 2023-07-27 DIAGNOSIS — O09523 Supervision of elderly multigravida, third trimester: Secondary | ICD-10-CM

## 2023-07-27 DIAGNOSIS — Z758 Other problems related to medical facilities and other health care: Secondary | ICD-10-CM

## 2023-07-27 DIAGNOSIS — Z3A34 34 weeks gestation of pregnancy: Secondary | ICD-10-CM

## 2023-07-27 DIAGNOSIS — O0990 Supervision of high risk pregnancy, unspecified, unspecified trimester: Secondary | ICD-10-CM

## 2023-07-27 DIAGNOSIS — Z98891 History of uterine scar from previous surgery: Secondary | ICD-10-CM

## 2023-07-27 NOTE — Patient Instructions (Signed)
Tubal Ligation with C/S or Postpartum   With C/S: $1170  Postpartum: $1280 + anesthesia   Patient needs to inform staff of desire for tubal ligation as early as possible in the pregnancy  Pre-payment is required for procedure to be scheduled  Payment plans are available upon request    Patient will need to call [facility] (Anesthesia) at [phone] for estimate   Ligadura de trompas con cesrea o despus del parto Con cesrea: $1170 Despus del parto: $1280 ms la anestesia La paciente debe informarle al personal de su deseo de hacerse la ligadura de trompas tan pronto  como le sea posible durante el [place]. Se requiere el pago por adelantado para que el procedimiento pueda programarse. Los planes de pago estn disponibles por solicitud. La paciente debe llamar a [facility] Janann August) al [phone] para pedir la cotizacin.

## 2023-07-27 NOTE — Progress Notes (Addendum)
   PRENATAL VISIT NOTE  Subjective:  Dana Gibbs is a 42 y.o. G3P2002 at [redacted]w[redacted]d being seen today for ongoing prenatal care.  She is currently monitored for the following issues for this high-risk pregnancy and has Complex sclerosing lesion of right breast; Supervision of high risk pregnancy, antepartum; Advanced maternal age in multigravida; History of 2 cesarean sections; Request for sterilization; and Language barrier on their problem list.  Patient doing well with no acute concerns today. She reports no complaints.  Contractions: Not present. Vag. Bleeding: None.  Movement: Present. Denies leaking of fluid.   The following portions of the patient's history were reviewed and updated as appropriate: allergies, current medications, past family history, past medical history, past social history, past surgical history and problem list. Problem list updated.  Objective:   Vitals:   07/27/23 1344  BP: 116/72  Pulse: 79  Weight: 164 lb 6.4 oz (74.6 kg)    Fetal Status: Fetal Heart Rate (bpm): 148 Fundal Height: 34 cm Movement: Present     General:  Alert, oriented and cooperative. Patient is in no acute distress.  Skin: Skin is warm and dry. No rash noted.   Cardiovascular: Normal heart rate noted  Respiratory: Normal respiratory effort, no problems with respiration noted  Abdomen: Soft, gravid, appropriate for gestational age.  Pain/Pressure: Absent     Pelvic: Cervical exam deferred        Extremities: Normal range of motion.  Edema: None  Mental Status:  Normal mood and affect. Normal behavior. Normal judgment and thought content.   Assessment and Plan:  Pregnancy: G3P2002 at [redacted]w[redacted]d  1. [redacted] weeks gestation of pregnancy   2. Multigravida of advanced maternal age in third trimester   3. History of 2 cesarean sections Pt scheduled for repeat c section with BTL, info given for tubal ligation cost, pt has active financial assistance - Ambulatory Referral For  Surgery Scheduling  4. Language barrier Teleinterpreter used  5. Request for sterilization Repeat c section with tubal posted  6. Supervision of high risk pregnancy, antepartum Continue routine prenatal care - Ambulatory Referral For Surgery Scheduling  Preterm labor symptoms and general obstetric precautions including but not limited to vaginal bleeding, contractions, leaking of fluid and fetal movement were reviewed in detail with the patient.  Please refer to After Visit Summary for other counseling recommendations.   Return in about 2 weeks (around 08/10/2023) for 36 weeks swabs.   Mariel Aloe, MD Faculty Attending Center for [facility]

## 2023-07-31 ENCOUNTER — Other Ambulatory Visit: Payer: Self-pay | Admitting: Obstetrics and Gynecology

## 2023-07-31 DIAGNOSIS — Z01818 Encounter for other preprocedural examination: Secondary | ICD-10-CM

## 2023-08-08 ENCOUNTER — Ambulatory Visit (INDEPENDENT_AMBULATORY_CARE_PROVIDER_SITE_OTHER): Payer: Self-pay | Admitting: Obstetrics and Gynecology

## 2023-08-08 ENCOUNTER — Other Ambulatory Visit (HOSPITAL_COMMUNITY)
Admission: RE | Admit: 2023-08-08 | Discharge: 2023-08-08 | Disposition: A | Discharge status: Home / Self Care | Payer: Self-pay | Source: Ambulatory Visit | Attending: Obstetrics and Gynecology | Admitting: Obstetrics and Gynecology

## 2023-08-08 VITALS — BP 106/67 | HR 73 | Wt 170.0 lb

## 2023-08-08 DIAGNOSIS — Z758 Other problems related to medical facilities and other health care: Secondary | ICD-10-CM

## 2023-08-08 DIAGNOSIS — Z302 Encounter for sterilization: Secondary | ICD-10-CM

## 2023-08-08 DIAGNOSIS — O09523 Supervision of elderly multigravida, third trimester: Secondary | ICD-10-CM

## 2023-08-08 DIAGNOSIS — Z3A36 36 weeks gestation of pregnancy: Secondary | ICD-10-CM

## 2023-08-08 DIAGNOSIS — O0990 Supervision of high risk pregnancy, unspecified, unspecified trimester: Secondary | ICD-10-CM

## 2023-08-08 DIAGNOSIS — Z98891 History of uterine scar from previous surgery: Secondary | ICD-10-CM

## 2023-08-08 DIAGNOSIS — Z603 Acculturation difficulty: Secondary | ICD-10-CM

## 2023-08-08 NOTE — Progress Notes (Signed)
Interpreter KG#[id number]  Pt complains of bilateral leg pain radiates to feet.

## 2023-08-08 NOTE — Progress Notes (Signed)
   PRENATAL VISIT NOTE  Subjective:  Dana Gibbs is a 42 y.o. G3P2002 at [redacted]w[redacted]d being seen today for ongoing prenatal care.  She is currently monitored for the following issues for this high-risk pregnancy and has Complex sclerosing lesion of right breast; Supervision of high risk pregnancy, antepartum; Advanced maternal age in multigravida; History of 2 cesarean sections; Request for sterilization; and Language barrier on their problem list.  Patient doing well with no acute concerns today. She reports  lower extremity symptoms c/w sciatica .  Contractions: Not present. Vag. Bleeding: None.  Movement: Present. Denies leaking of fluid.   The following portions of the patient's history were reviewed and updated as appropriate: allergies, current medications, past family history, past medical history, past social history, past surgical history and problem list. Problem list updated.  Objective:   Vitals:   08/08/23 1336  BP: 106/67  Pulse: 73  Weight: 170 lb (77.1 kg)    Fetal Status: Fetal Heart Rate (bpm): 165 Fundal Height: 36 cm Movement: Present     General:  Alert, oriented and cooperative. Patient is in no acute distress.  Skin: Skin is warm and dry. No rash noted.   Cardiovascular: Normal heart rate noted  Respiratory: Normal respiratory effort, no problems with respiration noted  Abdomen: Soft, gravid, appropriate for gestational age.  Pain/Pressure: Present     Pelvic: Cervical exam deferred        Extremities: Normal range of motion.     Mental Status:  Normal mood and affect. Normal behavior. Normal judgment and thought content.   Assessment and Plan:  Pregnancy: G3P2002 at [redacted]w[redacted]d  1. Supervision of high risk pregnancy, antepartum Continue routine prenatal care  - Culture, beta strep (group b only) - Cervicovaginal ancillary only( [place])  2. [redacted] weeks gestation of pregnancy  - Culture, beta strep (group b only) - Cervicovaginal  ancillary only( [place])  3. Multigravida of advanced maternal age in third trimester   4. History of 2 cesarean sections Pt is scheduled for repeat c section with BTL on 08/29/23  5. Language barrier Tele interpreter used  6. Request for sterilization As previous pt has filled out payment assistance forms  Preterm labor symptoms and general obstetric precautions including but not limited to vaginal bleeding, contractions, leaking of fluid and fetal movement were reviewed in detail with the patient.  Please refer to After Visit Summary for other counseling recommendations.   Return in about 1 week (around 08/15/2023) for [facility], in person.   Mariel Aloe, MD Faculty Attending Center for [facility]

## 2023-08-09 LAB — CERVICOVAGINAL ANCILLARY ONLY
Chlamydia: NEGATIVE
Comment: NEGATIVE
Comment: NORMAL
Neisseria Gonorrhea: NEGATIVE

## 2023-08-11 LAB — CULTURE, BETA STREP (GROUP B ONLY): Strep Gp B Culture: POSITIVE — AB

## 2023-08-15 ENCOUNTER — Encounter (HOSPITAL_COMMUNITY): Payer: Self-pay

## 2023-08-15 ENCOUNTER — Ambulatory Visit (INDEPENDENT_AMBULATORY_CARE_PROVIDER_SITE_OTHER): Payer: Self-pay | Admitting: Obstetrics and Gynecology

## 2023-08-15 ENCOUNTER — Encounter: Payer: Self-pay | Admitting: Obstetrics and Gynecology

## 2023-08-15 VITALS — BP 112/71 | HR 84 | Wt 171.2 lb

## 2023-08-15 DIAGNOSIS — O09523 Supervision of elderly multigravida, third trimester: Secondary | ICD-10-CM

## 2023-08-15 DIAGNOSIS — Z758 Other problems related to medical facilities and other health care: Secondary | ICD-10-CM

## 2023-08-15 DIAGNOSIS — Z603 Acculturation difficulty: Secondary | ICD-10-CM

## 2023-08-15 DIAGNOSIS — B951 Streptococcus, group B, as the cause of diseases classified elsewhere: Secondary | ICD-10-CM | POA: Insufficient documentation

## 2023-08-15 DIAGNOSIS — Z98891 History of uterine scar from previous surgery: Secondary | ICD-10-CM

## 2023-08-15 DIAGNOSIS — Z302 Encounter for sterilization: Secondary | ICD-10-CM

## 2023-08-15 DIAGNOSIS — O0990 Supervision of high risk pregnancy, unspecified, unspecified trimester: Secondary | ICD-10-CM

## 2023-08-15 NOTE — Patient Instructions (Addendum)
Instrucciones Para Antes de la Ciruga   Su ciruga est programada para 08/29/2023  (your procedure is scheduled on) Entre por la entrada principal del [facility]  a las [address] de la [place] -(enter through the main entrance at [facility] at [employer] AM    [address] [place], [place] [phone] para informarnos de su llegada. (pick up phone, dial [phone] on arrival)     Por favor llame al [phone] si tiene algn problema en la maana de la ciruga (please call this number if you have any problems the morning of surgery.)                  Recuerde: (Remember)  No coma alimentos. (Do not eat food (After Midnight) Desps de medianoche)    No tome lquidos claros. Puede tomar lquidos claros/transparentes hasta su hora de llegada a las _____1000____. Los lquidos claros son aquellos a travs de los cuales se puede ver. Pueden tener color, como la Cola o el Kool-Aid. Puede tomar t y caf, siempre y cuando no contengan leche ni ningn tipo de crema.   No use joyas, maquillaje de ojos, lpiz labial, crema para el cuerpo o esmalte de uas oscuro. (Do not wear jewelry, eye makeup, lipstick, body lotion, or dark fingernail polish). Puede usar desodorante (you may wear deodorant)    No se afeite 48 horas de su ciruga. (Do not shave 48 hours before your surgery)    No traiga objetos de valor al hospital.  [place] no se hace responsable de ninguna pertenencia, ni objetos de valor que haya trado al hospital. (Do not bring valuable to the hospital.   is not responsible for any belongings or valuables brought to the hospital)   [facility] medicinas en la maana de la ciruga con un SORBITO de agua nada (take these meds the morning of surgery with a SIP of water)     Durante la ciruga no se pueden usar lentes de contacto, dentaduras o puentes. (Contacts, dentures or bridgework cannot be worn in surgery).   Si va a ser ingresado despus  de la ciruga, deje la [employer] en el carro hasta que se le haya asignado una habitacin. (If you are to be admitted after surgery, leave suitcase in car until your room has been assigned.)   A los pacientes que se les d de alta el mismo da no se les permitir manejar a casa.  (Patients discharged on the day of surgery will not be allowed to drive home)    [place] y [place]mer[place] de [place] del Programmer, multimedia NA. (Name and telephone number of your driver)   Instrucciones especiales Shower using CHG 2 nights before surgery and the night before surgery.  If you shower the day of surgery use CHG.  Use special wash - you have one bottle of CHG for all showers.  You should use approximately 1/3 of the bottle for each shower. (Special Instructions)   Por favor, lea las hojas informativas que le entregaron. (Please read over the following fact sheets that you were given) Surgical Site Infection Prevention

## 2023-08-15 NOTE — Progress Notes (Signed)
   PRENATAL VISIT NOTE  Subjective:  Dana Gibbs is a 42 y.o. G3P2002 at [redacted]w[redacted]d being seen today for ongoing prenatal care.  She is currently monitored for the following issues for this high-risk pregnancy and has Complex sclerosing lesion of right breast; Supervision of high risk pregnancy, antepartum; Advanced maternal age in multigravida; History of 2 cesarean sections; Request for sterilization; Language barrier; and Positive GBS test on their problem list.  Patient reports  sciatica ongoing down R leg .  Contractions: Not present.  No bleeding.  Movement: Present. Denies leaking of fluid.   The following portions of the patient's history were reviewed and updated as appropriate: allergies, current medications, past family history, past medical history, past social history, past surgical history and problem list.   Objective:   Vitals:   08/15/23 1335  BP: 112/71  Pulse: 84  Weight: 171 lb 3.2 oz (77.7 kg)    Fetal Status: Fetal Heart Rate (bpm): 155 Fundal Height: 38 cm Movement: Present  Presentation: Vertex  General:  Alert, oriented and cooperative. Patient is in no acute distress.  Skin: Skin is warm and dry. No rash noted.   Cardiovascular: Normal heart rate noted  Respiratory: Normal respiratory effort, no problems with respiration noted  Abdomen: Soft, gravid, appropriate for gestational age.  Pain/Pressure: Present     Pelvic: Cervical exam deferred        Extremities: Normal range of motion.  Edema: None  Mental Status: Normal mood and affect. Normal behavior. Normal judgment and thought content.   Assessment and Plan:  Pregnancy: G3P2002 at [redacted]w[redacted]d 1. Supervision of high risk pregnancy, antepartum Doing well. Regular and vigorous fetal movement. Taking PO iron.  2. Multigravida of advanced maternal age in third trimester Noted. On ASA, folic acid.  3. History of 2 cesarean sections Repeat LTCS w/tubal scheduled for 39 weeks.  4. Request  for sterilization  5. Language Chief Operating Officer used.  6. Positive GBS test  Term labor symptoms and general obstetric precautions including but not limited to vaginal bleeding, contractions, leaking of fluid and fetal movement were reviewed in detail with the patient. Please refer to After Visit Summary for other counseling recommendations.   Future Appointments  Date Time Provider Department Center  08/22/2023  1:30 PM Adam Phenix, MD [facility] None  08/28/2023  9:00 AM [facility] PAT 1 [facility] None  08/29/2023  1:30 PM Joanne Gavel, MD [facility] None  09/04/2023  1:30 PM Constant, Gigi Gin, MD [facility] None    Joanne Gavel, MD

## 2023-08-15 NOTE — Progress Notes (Signed)
Pt presents for ROB visit. No concerns.  

## 2023-08-22 ENCOUNTER — Encounter: Payer: Self-pay | Admitting: Obstetrics & Gynecology

## 2023-08-28 ENCOUNTER — Encounter (HOSPITAL_COMMUNITY)
Admission: RE | Admit: 2023-08-28 | Discharge: 2023-08-28 | Disposition: A | Discharge status: Home / Self Care | Payer: Self-pay | Source: Ambulatory Visit | Attending: Family Medicine

## 2023-08-28 DIAGNOSIS — Z01812 Encounter for preprocedural laboratory examination: Secondary | ICD-10-CM | POA: Insufficient documentation

## 2023-08-28 DIAGNOSIS — Z01818 Encounter for other preprocedural examination: Secondary | ICD-10-CM

## 2023-08-28 LAB — CBC
HCT: 27.9 % — ABNORMAL LOW (ref 36.0–46.0)
Hemoglobin: 8.9 g/dL — ABNORMAL LOW (ref 12.0–15.0)
MCH: 27.7 pg (ref 26.0–34.0)
MCHC: 31.9 g/dL (ref 30.0–36.0)
MCV: 86.9 fL (ref 80.0–100.0)
Platelets: 262 10*3/uL (ref 150–400)
RBC: 3.21 MIL/uL — ABNORMAL LOW (ref 3.87–5.11)
RDW: 15.6 % — ABNORMAL HIGH (ref 11.5–15.5)
WBC: 5.6 10*3/uL (ref 4.0–10.5)
nRBC: 0 % (ref 0.0–0.2)

## 2023-08-28 LAB — RPR: RPR Ser Ql: NONREACTIVE

## 2023-08-28 NOTE — Anesthesia Preprocedure Evaluation (Addendum)
Anesthesia Evaluation  Patient identified by MRN, date of birth, ID band Patient awake    Reviewed: Allergy & Precautions, NPO status , Patient's Chart, lab work & pertinent test results  History of Anesthesia Complications (+) PONV and history of anesthetic complications  Airway Mallampati: II  TM Distance: >3 FB Neck ROM: Full    Dental no notable dental hx. (+) Teeth Intact, Dental Advisory Given   Pulmonary neg pulmonary ROS   Pulmonary exam normal breath sounds clear to auscultation       Cardiovascular Normal cardiovascular exam Rhythm:Regular Rate:Normal     Neuro/Psych negative neurological ROS     GI/Hepatic Neg liver ROS,GERD  ,,  Endo/Other  negative endocrine ROS    Renal/GU      Musculoskeletal negative musculoskeletal ROS (+)    Abdominal   Peds  Hematology  (+) Blood dyscrasia, anemia Lab Results      Component                Value               Date                      WBC                      5.6                 08/28/2023                HGB                      8.9 (L)             08/28/2023                HCT                      27.9 (L)            08/28/2023                MCV                      86.9                08/28/2023                PLT                      262                 08/28/2023      T&S available          Anesthesia Other Findings   Reproductive/Obstetrics (+) Pregnancy                              Anesthesia Physical Anesthesia Plan  ASA: 3  Anesthesia Plan: Spinal   Post-op Pain Management: Regional block* and Minimal or no pain anticipated   Induction:   PONV Risk Score and Plan: Treatment may vary due to age or medical condition and Ondansetron  Airway Management Planned: Nasal Cannula and Natural Airway  Additional Equipment: None  Intra-op Plan:   Post-operative Plan:   Informed Consent: I have reviewed the patients  History and Physical, chart, labs and discussed the procedure including  the risks, benefits and alternatives for the proposed anesthesia with the patient or authorized representative who has indicated his/her understanding and acceptance.     Dental advisory given and Interpreter used for interview  Plan Discussed with: CRNA and Anesthesiologist  Anesthesia Plan Comments: (39 wk G3p2 for repeat c.s x 3 + pptl)         Anesthesia Quick Evaluation

## 2023-08-29 ENCOUNTER — Inpatient Hospital Stay (HOSPITAL_COMMUNITY): Payer: Medicaid Other | Admitting: Anesthesiology

## 2023-08-29 ENCOUNTER — Encounter: Payer: Self-pay | Admitting: Obstetrics and Gynecology

## 2023-08-29 ENCOUNTER — Inpatient Hospital Stay (HOSPITAL_COMMUNITY)
Admission: RE | Admit: 2023-08-29 | Discharge: 2023-08-31 | DRG: 784 | Disposition: A | Discharge status: Home / Self Care | Payer: Medicaid Other | Attending: Obstetrics and Gynecology | Admitting: Obstetrics and Gynecology

## 2023-08-29 ENCOUNTER — Other Ambulatory Visit: Payer: Self-pay

## 2023-08-29 ENCOUNTER — Encounter (HOSPITAL_COMMUNITY)
Admission: RE | Disposition: A | Discharge status: Home / Self Care | Payer: Self-pay | Source: Home / Self Care | Attending: Obstetrics and Gynecology

## 2023-08-29 ENCOUNTER — Encounter (HOSPITAL_COMMUNITY): Payer: Self-pay | Admitting: Obstetrics and Gynecology

## 2023-08-29 DIAGNOSIS — Z3A39 39 weeks gestation of pregnancy: Secondary | ICD-10-CM

## 2023-08-29 DIAGNOSIS — N838 Other noninflammatory disorders of ovary, fallopian tube and broad ligament: Secondary | ICD-10-CM | POA: Diagnosis present

## 2023-08-29 DIAGNOSIS — O34211 Maternal care for low transverse scar from previous cesarean delivery: Principal | ICD-10-CM | POA: Diagnosis present

## 2023-08-29 DIAGNOSIS — Z302 Encounter for sterilization: Secondary | ICD-10-CM | POA: Diagnosis not present

## 2023-08-29 DIAGNOSIS — Z7982 Long term (current) use of aspirin: Secondary | ICD-10-CM | POA: Diagnosis not present

## 2023-08-29 DIAGNOSIS — O9962 Diseases of the digestive system complicating childbirth: Secondary | ICD-10-CM | POA: Diagnosis present

## 2023-08-29 DIAGNOSIS — O3483 Maternal care for other abnormalities of pelvic organs, third trimester: Secondary | ICD-10-CM | POA: Diagnosis present

## 2023-08-29 DIAGNOSIS — O09529 Supervision of elderly multigravida, unspecified trimester: Secondary | ICD-10-CM

## 2023-08-29 DIAGNOSIS — K219 Gastro-esophageal reflux disease without esophagitis: Secondary | ICD-10-CM | POA: Diagnosis present

## 2023-08-29 DIAGNOSIS — O99824 Streptococcus B carrier state complicating childbirth: Secondary | ICD-10-CM | POA: Diagnosis present

## 2023-08-29 DIAGNOSIS — O34219 Maternal care for unspecified type scar from previous cesarean delivery: Secondary | ICD-10-CM | POA: Diagnosis present

## 2023-08-29 DIAGNOSIS — D62 Acute posthemorrhagic anemia: Secondary | ICD-10-CM | POA: Diagnosis not present

## 2023-08-29 DIAGNOSIS — Z603 Acculturation difficulty: Secondary | ICD-10-CM | POA: Diagnosis present

## 2023-08-29 DIAGNOSIS — O9081 Anemia of the puerperium: Secondary | ICD-10-CM | POA: Diagnosis not present

## 2023-08-29 DIAGNOSIS — O99820 Streptococcus B carrier state complicating pregnancy: Secondary | ICD-10-CM

## 2023-08-29 DIAGNOSIS — O09523 Supervision of elderly multigravida, third trimester: Secondary | ICD-10-CM

## 2023-08-29 DIAGNOSIS — Z98891 History of uterine scar from previous surgery: Principal | ICD-10-CM

## 2023-08-29 DIAGNOSIS — B951 Streptococcus, group B, as the cause of diseases classified elsewhere: Secondary | ICD-10-CM | POA: Diagnosis present

## 2023-08-29 HISTORY — PX: TUBAL LIGATION: SHX77

## 2023-08-29 LAB — PREPARE RBC (CROSSMATCH)

## 2023-08-29 LAB — CREATININE, SERUM
Creatinine, Ser: 0.46 mg/dL (ref 0.44–1.00)
GFR, Estimated: >60 mL/min (ref >60)

## 2023-08-29 SURGERY — Surgical Case
Anesthesia: Spinal

## 2023-08-29 MED ORDER — SIMETHICONE 80 MG PO CHEW
80.0000 mg | CHEWABLE_TABLET | Freq: Three times a day (TID) | ORAL | Status: DC
  Administered 2023-08-30 – 2023-08-31 (×4): 80 mg via ORAL
  Filled 2023-08-29 (×4): qty 1

## 2023-08-29 MED ORDER — METOCLOPRAMIDE HCL 5 MG/ML IJ SOLN
10.0000 mg | Freq: Once | INTRAMUSCULAR | Status: AC
  Administered 2023-08-29: 10 mg via INTRAVENOUS
  Filled 2023-08-29: qty 2

## 2023-08-29 MED ORDER — BUPIVACAINE IN DEXTROSE 0.75-8.25 % IT SOLN
INTRATHECAL | Status: DC | PRN
  Administered 2023-08-29: 12.75 mL via INTRATHECAL

## 2023-08-29 MED ORDER — TRANEXAMIC ACID-NACL 1000-0.7 MG/100ML-% IV SOLN
INTRAVENOUS | Status: AC
Start: 2023-08-29 — End: ?
  Filled 2023-08-29: qty 100

## 2023-08-29 MED ORDER — TRANEXAMIC ACID-NACL 1000-0.7 MG/100ML-% IV SOLN
1000.0000 mg | Freq: Once | INTRAVENOUS | Status: AC
  Administered 2023-08-29: 1000 mg via INTRAVENOUS

## 2023-08-29 MED ORDER — DIPHENHYDRAMINE HCL 50 MG/ML IJ SOLN: 12.5000 mg | INTRAMUSCULAR | Status: DC | PRN

## 2023-08-29 MED ORDER — DIBUCAINE (PERIANAL) 1 % EX OINT: 1.0000 | TOPICAL_OINTMENT | CUTANEOUS | Status: DC | PRN

## 2023-08-29 MED ORDER — PRENATAL MULTIVITAMIN CH
1.0000 | ORAL_TABLET | Freq: Every day | ORAL | Status: DC
  Administered 2023-08-30 – 2023-08-31 (×2): 1 via ORAL
  Filled 2023-08-29 (×2): qty 1

## 2023-08-29 MED ORDER — SODIUM CHLORIDE 0.9% FLUSH
10.0000 mL | Freq: Two times a day (BID) | INTRAVENOUS | Status: DC
  Administered 2023-08-30: 10 mL via INTRAVENOUS

## 2023-08-29 MED ORDER — OXYTOCIN-SODIUM CHLORIDE 30-0.9 UT/500ML-% IV SOLN
INTRAVENOUS | Status: AC
  Filled 2023-08-29: qty 500

## 2023-08-29 MED ORDER — ONDANSETRON HCL 4 MG/2ML IJ SOLN
INTRAMUSCULAR | Status: AC
Start: 2023-08-29 — End: ?
  Filled 2023-08-29: qty 2

## 2023-08-29 MED ORDER — MORPHINE SULFATE (PF) 0.5 MG/ML IJ SOLN
INTRAMUSCULAR | Status: AC
  Filled 2023-08-29: qty 10

## 2023-08-29 MED ORDER — STERILE WATER FOR IRRIGATION IR SOLN
Status: DC | PRN
  Administered 2023-08-29: 1

## 2023-08-29 MED ORDER — ONDANSETRON HCL 4 MG/2ML IJ SOLN
INTRAMUSCULAR | Status: DC | PRN
  Administered 2023-08-29: 4 mg via INTRAVENOUS

## 2023-08-29 MED ORDER — LACTATED RINGERS IV SOLN: INTRAVENOUS | Status: AC

## 2023-08-29 MED ORDER — MENTHOL 3 MG MT LOZG: 1.0000 | LOZENGE | OROMUCOSAL | Status: DC | PRN

## 2023-08-29 MED ORDER — ACETAMINOPHEN 10 MG/ML IV SOLN
INTRAVENOUS | Status: DC | PRN
  Administered 2023-08-29: 1000 mg via INTRAVENOUS

## 2023-08-29 MED ORDER — DEXAMETHASONE SODIUM PHOSPHATE 10 MG/ML IJ SOLN
INTRAMUSCULAR | Status: DC | PRN
  Administered 2023-08-29: 4 mg via INTRAVENOUS

## 2023-08-29 MED ORDER — OXYCODONE HCL 5 MG PO TABS
5.0000 mg | ORAL_TABLET | Freq: Four times a day (QID) | ORAL | Status: DC | PRN
Start: 2023-08-29 — End: 2023-08-31
  Administered 2023-08-31: 5 mg via ORAL
  Filled 2023-08-29: qty 1

## 2023-08-29 MED ORDER — ONDANSETRON HCL 4 MG/2ML IJ SOLN
4.0000 mg | Freq: Once | INTRAMUSCULAR | Status: AC | PRN
  Administered 2023-08-29: 4 mg via INTRAVENOUS

## 2023-08-29 MED ORDER — HYDROMORPHONE HCL 1 MG/ML IJ SOLN: 0.2500 mg | INTRAMUSCULAR | Status: DC | PRN

## 2023-08-29 MED ORDER — FENTANYL CITRATE (PF) 100 MCG/2ML IJ SOLN
INTRAMUSCULAR | Status: DC | PRN
  Administered 2023-08-29: 15 ug via INTRATHECAL

## 2023-08-29 MED ORDER — COCONUT OIL OIL: 1.0000 | TOPICAL_OIL | Status: DC | PRN

## 2023-08-29 MED ORDER — ACETAMINOPHEN 500 MG PO TABS
1000.0000 mg | ORAL_TABLET | Freq: Four times a day (QID) | ORAL | Status: DC
  Administered 2023-08-29 – 2023-08-31 (×6): 1000 mg via ORAL
  Filled 2023-08-29 (×6): qty 2

## 2023-08-29 MED ORDER — KETOROLAC TROMETHAMINE 30 MG/ML IJ SOLN: 30.0000 mg | Freq: Once | INTRAMUSCULAR | Status: DC | PRN

## 2023-08-29 MED ORDER — DIPHENHYDRAMINE HCL 25 MG PO CAPS
25.0000 mg | ORAL_CAPSULE | ORAL | Status: DC | PRN
Start: 2023-08-29 — End: 2023-08-31

## 2023-08-29 MED ORDER — ENOXAPARIN SODIUM 40 MG/0.4ML IJ SOSY
40.0000 mg | PREFILLED_SYRINGE | INTRAMUSCULAR | Status: DC
  Administered 2023-08-31: 40 mg via SUBCUTANEOUS
  Filled 2023-08-29: qty 0.4

## 2023-08-29 MED ORDER — ONDANSETRON HCL 4 MG/2ML IJ SOLN: 4.0000 mg | Freq: Three times a day (TID) | INTRAMUSCULAR | Status: DC | PRN

## 2023-08-29 MED ORDER — MORPHINE SULFATE (PF) 0.5 MG/ML IJ SOLN
INTRAMUSCULAR | Status: DC | PRN
  Administered 2023-08-29: 150 ug via INTRATHECAL

## 2023-08-29 MED ORDER — ACETAMINOPHEN 10 MG/ML IV SOLN
INTRAVENOUS | Status: AC
  Filled 2023-08-29: qty 100

## 2023-08-29 MED ORDER — OXYTOCIN-SODIUM CHLORIDE 30-0.9 UT/500ML-% IV SOLN
2.5000 [IU]/h | INTRAVENOUS | Status: AC
  Administered 2023-08-29 – 2023-08-30 (×2): 2.5 [IU]/h via INTRAVENOUS
  Filled 2023-08-29: qty 500

## 2023-08-29 MED ORDER — WITCH HAZEL-GLYCERIN EX PADS: 1.0000 | MEDICATED_PAD | CUTANEOUS | Status: DC | PRN

## 2023-08-29 MED ORDER — SOD CITRATE-CITRIC ACID 500-334 MG/5ML PO SOLN
ORAL | Status: AC
  Filled 2023-08-29: qty 30

## 2023-08-29 MED ORDER — IBUPROFEN 600 MG PO TABS
600.0000 mg | ORAL_TABLET | Freq: Four times a day (QID) | ORAL | Status: DC
Start: 2023-08-30 — End: 2023-08-31
  Administered 2023-08-30 – 2023-08-31 (×4): 600 mg via ORAL
  Filled 2023-08-29 (×4): qty 1

## 2023-08-29 MED ORDER — MEPERIDINE HCL 25 MG/ML IJ SOLN: 6.2500 mg | INTRAMUSCULAR | Status: DC | PRN

## 2023-08-29 MED ORDER — OXYTOCIN-SODIUM CHLORIDE 30-0.9 UT/500ML-% IV SOLN
INTRAVENOUS | Status: DC | PRN
  Administered 2023-08-29: 300 mL via INTRAVENOUS

## 2023-08-29 MED ORDER — CEFAZOLIN SODIUM-DEXTROSE 2-4 GM/100ML-% IV SOLN
2.0000 g | INTRAVENOUS | Status: AC
  Administered 2023-08-29: 2 g via INTRAVENOUS

## 2023-08-29 MED ORDER — SODIUM CHLORIDE 0.9 % IV SOLN
12.5000 mg | Freq: Once | INTRAVENOUS | Status: AC
  Administered 2023-08-29: 12.5 mg via INTRAVENOUS
  Filled 2023-08-29: qty 12.5

## 2023-08-29 MED ORDER — OXYCODONE HCL 5 MG PO TABS: 5.0000 mg | ORAL_TABLET | Freq: Once | ORAL | Status: DC | PRN

## 2023-08-29 MED ORDER — NALOXONE HCL 0.4 MG/ML IJ SOLN: 0.4000 mg | INTRAMUSCULAR | Status: DC | PRN

## 2023-08-29 MED ORDER — SCOPOLAMINE 1 MG/3DAYS TD PT72
1.0000 | MEDICATED_PATCH | Freq: Once | TRANSDERMAL | Status: DC
Start: 2023-08-29 — End: 2023-08-31
  Administered 2023-08-29: 1.5 mg via TRANSDERMAL
  Filled 2023-08-29: qty 1

## 2023-08-29 MED ORDER — PHENYLEPHRINE HCL-NACL 20-0.9 MG/250ML-% IV SOLN
INTRAVENOUS | Status: AC
  Filled 2023-08-29: qty 250

## 2023-08-29 MED ORDER — NALOXONE HCL 4 MG/10ML IJ SOLN: 1.0000 ug/kg/h | INTRAVENOUS | Status: DC | PRN

## 2023-08-29 MED ORDER — DIPHENHYDRAMINE HCL 25 MG PO CAPS: 25.0000 mg | ORAL_CAPSULE | Freq: Four times a day (QID) | ORAL | Status: DC | PRN

## 2023-08-29 MED ORDER — KETOROLAC TROMETHAMINE 30 MG/ML IJ SOLN
30.0000 mg | Freq: Four times a day (QID) | INTRAMUSCULAR | Status: AC
  Administered 2023-08-29 – 2023-08-30 (×3): 30 mg via INTRAVENOUS
  Filled 2023-08-29 (×3): qty 1

## 2023-08-29 MED ORDER — SOD CITRATE-CITRIC ACID 500-334 MG/5ML PO SOLN
30.0000 mL | ORAL | Status: AC
  Administered 2023-08-29: 30 mL via ORAL

## 2023-08-29 MED ORDER — SIMETHICONE 80 MG PO CHEW: 80.0000 mg | CHEWABLE_TABLET | ORAL | Status: DC | PRN

## 2023-08-29 MED ORDER — SODIUM CHLORIDE 0.9% FLUSH
3.0000 mL | INTRAVENOUS | Status: DC | PRN
  Administered 2023-08-31: 3 mL via INTRAVENOUS

## 2023-08-29 MED ORDER — CEFAZOLIN SODIUM-DEXTROSE 2-4 GM/100ML-% IV SOLN
INTRAVENOUS | Status: AC
  Filled 2023-08-29: qty 100

## 2023-08-29 MED ORDER — FENTANYL CITRATE (PF) 100 MCG/2ML IJ SOLN
INTRAMUSCULAR | Status: AC
  Filled 2023-08-29: qty 2

## 2023-08-29 MED ORDER — OXYCODONE HCL 5 MG/5ML PO SOLN
5.0000 mg | Freq: Once | ORAL | Status: DC | PRN
Start: 2023-08-29 — End: 2023-08-29

## 2023-08-29 MED ORDER — GABAPENTIN 100 MG PO CAPS
200.0000 mg | ORAL_CAPSULE | Freq: Every day | ORAL | Status: DC
  Administered 2023-08-29: 200 mg via ORAL
  Filled 2023-08-29: qty 2

## 2023-08-29 MED ORDER — PHENYLEPHRINE HCL-NACL 20-0.9 MG/250ML-% IV SOLN
INTRAVENOUS | Status: DC | PRN
  Administered 2023-08-29: 60 ug/min via INTRAVENOUS

## 2023-08-29 MED ORDER — MAGNESIUM HYDROXIDE 400 MG/5ML PO SUSP
30.0000 mL | ORAL | Status: DC | PRN
Start: 2023-08-29 — End: 2023-08-31

## 2023-08-29 MED ORDER — SENNOSIDES-DOCUSATE SODIUM 8.6-50 MG PO TABS
2.0000 | ORAL_TABLET | Freq: Every day | ORAL | Status: DC
  Administered 2023-08-30 – 2023-08-31 (×2): 2 via ORAL
  Filled 2023-08-29 (×2): qty 2

## 2023-08-29 MED ORDER — SODIUM CHLORIDE 0.9% IV SOLUTION: Freq: Once | INTRAVENOUS | Status: DC

## 2023-08-29 SURGICAL SUPPLY — 34 items
BENZOIN TINCTURE PRP APPL 2/3 (GAUZE/BANDAGES/DRESSINGS) ×2 IMPLANT
CANISTER SUCT 3000ML PPV (MISCELLANEOUS) ×2 IMPLANT
CHLORAPREP W/TINT 26ML (MISCELLANEOUS) ×4 IMPLANT
CLAMP UMBILICAL CORD (MISCELLANEOUS) ×2 IMPLANT
DRSG OPSITE POSTOP 4X10 (GAUZE/BANDAGES/DRESSINGS) ×2 IMPLANT
ELECT REM PT RETURN 9FT ADLT (ELECTROSURGICAL) ×2
ELECTRODE REM PT RTRN 9FT ADLT (ELECTROSURGICAL) ×2 IMPLANT
EXTRACTOR VACUUM KIWI (MISCELLANEOUS) ×2 IMPLANT
GAUZE SPONGE 4X4 12PLY STRL LF (GAUZE/BANDAGES/DRESSINGS) IMPLANT
GLOVE BIOGEL PI IND STRL 7.0 (GLOVE) ×4 IMPLANT
GLOVE INDICATOR 7.5 STRL GRN (GLOVE) ×2 IMPLANT
GLOVE SURG SS PI 7.0 STRL IVOR (GLOVE) ×2 IMPLANT
GOWN STRL REUS W/ TWL LRG LVL3 (GOWN DISPOSABLE) ×4 IMPLANT
GOWN STRL REUS W/ TWL XL LVL3 (GOWN DISPOSABLE) ×2 IMPLANT
LIGASURE IMPACT 36 18CM CVD LR (INSTRUMENTS) IMPLANT
NS IRRIG 1000ML POUR BTL (IV SOLUTION) ×2 IMPLANT
PACK C SECTION WH (CUSTOM PROCEDURE TRAY) ×2 IMPLANT
PAD ABD 7.5X8 STRL (GAUZE/BANDAGES/DRESSINGS) ×2 IMPLANT
PAD ABD DERMACEA PRESS 5X9 (GAUZE/BANDAGES/DRESSINGS) IMPLANT
PAD OB MATERNITY 4.3X12.25 (PERSONAL CARE ITEMS) ×2 IMPLANT
PAD PREP 24X48 CUFFED NSTRL (MISCELLANEOUS) ×2 IMPLANT
RETRACTOR TRAXI PANNICULUS (MISCELLANEOUS) IMPLANT
RETRACTOR WND ALEXIS 25 LRG (MISCELLANEOUS) ×2 IMPLANT
RTRCTR WOUND ALEXIS 25CM LRG (MISCELLANEOUS) ×2
STRIP CLOSURE SKIN 1/2X4 (GAUZE/BANDAGES/DRESSINGS) ×2 IMPLANT
SUT MNCRL 0 VIOLET CTX 36 (SUTURE) ×4 IMPLANT
SUT MON AB 4-0 PS1 27 (SUTURE) ×2 IMPLANT
SUT PLAIN 0 NONE (SUTURE) IMPLANT
SUT PLAIN 2 0 XLH (SUTURE) ×2 IMPLANT
SUT VIC AB 0 CT1 27XBRD ANBCTR (SUTURE) ×4 IMPLANT
SUT VIC AB 3-0 CT1 TAPERPNT 27 (SUTURE) ×2 IMPLANT
SUT VICRYL 2 0 18 TIES (SUTURE) IMPLANT
TOWEL OR 17X24 6PK STRL BLUE (TOWEL DISPOSABLE) ×4 IMPLANT
WATER STERILE IRR 1000ML POUR (IV SOLUTION) ×2 IMPLANT

## 2023-08-29 NOTE — Lactation Note (Signed)
This note was copied from a baby's chart. Lactation Consultation Note  Patient Name: Dana Gibbs del Transito jazara raygor Today's Date: 08/29/2023 Age:42 hours Reason for consult: Initial assessment;Term In house Spanish Interpreter used : [name] did not observe latch due infant recently being formula feed 15 mls of 20 kcal formula at 1745 pm. Per MOB her feeding choice is breast and formula feeding infant she did this with her previous children. See below for MOB hx of breastfeeding previous children. Infant had 2 voids and 3 stools since birth. LC suggested MOB latch infant first for every feeding to help stimulate and establish her milk supply and afterwards if she chooses then offer formula. MOB was given handout " Supplementing with breastfeeding". LC discussed the importance of maternal rest, balance diet and hydration. MOB was  made aware of O/P services, breastfeeding support groups, community resources, and our phone # for post-discharge questions.    Current feeding plan: 1- MOB will latch infant 1st for every feeding, by cues, on demand, every 2-3 hours, skin to skin. 2- MOB knows to call for latch assistance if needed. 3- Mom's choice after latching infant at breast she will supplement with formula.   Maternal Data Has patient been taught Hand Expression?: Yes Does the patient have breastfeeding experience prior to this delivery?: Yes How long did the patient breastfeed?: Per MOB, she BF 1st child for 6 months and 2nd child for 11 months.  Feeding Mother's Current Feeding Choice: Breast Milk and Formula Nipple Type: Slow - flow  LATCH Score LC did not observe latch due infant recently been bottle feed ( formula).                    Lactation Tools Discussed/Used    Interventions Interventions: Breast feeding basics reviewed;Skin to skin;Position options;Education;Pace feeding;LC Services brochure  Discharge WIC Program: No  Consult Status Consult  Status: Follow-up Date: 08/30/23 Follow-up type: In-patient    Frederico Hamman 08/29/2023, 7:53 PM

## 2023-08-29 NOTE — Transfer of Care (Signed)
Immediate Anesthesia Transfer of Care Note  Patient: Dana Gibbs  Procedure(s) Performed: CESAREAN SECTION WITH BILATERAL TUBAL LIGATION BILATERAL TUBAL LIGATION (Bilateral)  Patient Location: PACU  Anesthesia Type:Spinal  Level of Consciousness: awake  Airway & Oxygen Therapy: Patient Spontanous Breathing  Post-op Assessment: Report given to RN and Post -op Vital signs reviewed and stable  Post vital signs: Reviewed and stable  Last Vitals:  Vitals Value Taken Time  BP 116/74 08/29/23 1207  Temp    Pulse 70 08/29/23 1210  Resp 26 08/29/23 1210  SpO2 100 % 08/29/23 1210  Vitals shown include unfiled device data.  Last Pain:  Vitals:   08/29/23 0827  TempSrc: Oral      Patients Stated Pain Goal: 5 (08/29/23 0827)  Complications: No notable events documented.

## 2023-08-29 NOTE — Progress Notes (Signed)
Created in error

## 2023-08-29 NOTE — Anesthesia Postprocedure Evaluation (Signed)
Anesthesia Post Note  Patient: Byrd Hesselbach del Transito Trevor Mace  Procedure(s) Performed: CESAREAN SECTION WITH BILATERAL TUBAL LIGATION BILATERAL TUBAL LIGATION (Bilateral)     Patient location during evaluation: Mother Baby Anesthesia Type: Spinal Level of consciousness: oriented and awake and alert Pain management: pain level controlled Vital Signs Assessment: post-procedure vital signs reviewed and stable Respiratory status: spontaneous breathing and respiratory function stable Cardiovascular status: blood pressure returned to baseline and stable Postop Assessment: no headache, no backache, no apparent nausea or vomiting and able to ambulate Anesthetic complications: no   No notable events documented.  Last Vitals:  Vitals:   08/29/23 1300 08/29/23 1333  BP: 109/82 101/64  Pulse: 77 (!) 57  Resp: (!) 25 20  Temp:  36.4 C  SpO2: 100% 100%    Last Pain:  Vitals:   08/29/23 1333  TempSrc: Oral  PainSc:    Pain Goal: Patients Stated Pain Goal: 5 (08/29/23 0827)                 Trevor Iha

## 2023-08-29 NOTE — Op Note (Addendum)
Operative Note   SURGERY DATE: 08/29/2023  PRE-OP DIAGNOSIS:  *Pregnancy at 39/0 *History of cesarean section x 2 *Desire for permanent sterilization *AMA  POST-OP DIAGNOSIS: Same. Large right, complex paratubal cyst   PROCEDURE: Repeat low transverse cesarean section via pfannenstiel skin incision with single layer uterine closure, bilateral salpingectomies, removal of right paratubal cyst.   SURGEON: Surgeons and Role:    * McKinney Bing, MD - Primary   ASSISTANT:   Sundra Aland, MD - Assisting  An experienced assistant was required given the standard of surgical care given the complexity of the case.  This assistant was needed for exposure, dissection, suctioning, retraction, instrument exchange, assisting with delivery with administration of fundal pressure, and for overall help during the procedure.  ANESTHESIA: spinal  ESTIMATED BLOOD LOSS:   DRAINS: UOP via indwelling foley  TOTAL IV FLUIDS: per OR note  VTE PROPHYLAXIS: SCDs to bilateral lower extremities  ANTIBIOTICS: Two grams of Cefazolin were given., within 1 hour of skin incision  SPECIMENS: placenta, left fallopian tube,  and right fallopian tube paratubal cyst (the latter two sent together) sent to pathology  COMPLICATIONS: None  FINDINGS: No intra-abdominal adhesions were noted. At the right adnexa, there was a 5cm cyst that was fluid filled but also felt to have a solid component. This was below the fimbriae of the right ovary. The cyst was not ruptured.   Grossly normal uterus, tubes and ovaries, clear amniotic fluid, cephalic,  female infant, weight 3190gm, APGARs 9/9, intact placenta.  PROCEDURE IN DETAIL: The patient was taken to the operating room where anesthesia was administered and normal fetal heart tones were confirmed. She was then prepped and draped in the normal fashion in the dorsal supine position with a leftward tilt.  After a time out was performed, a pfannensteil skin  incision was made with the scalpel and carried through to the underlying layer of fascia. The fascia was then incised at the midline and this incision was extended laterally with the mayo scissors. Attention was turned to the superior aspect of the fascial incision which was grasped with the kocher clamps x 2, tented up and the rectus muscles were dissected off with the scalpel. In a similar fashion the inferior aspect of the fascial incision was grasped with the kocher clamps, tented up and the rectus muscles dissected off with the mayo scissors. The rectus muscles were then separated in the midline and the peritoneum was entered bluntly. The bladder blade was inserted and the vesicouterine peritoneum was identified, tented up and entered with the metzenbaum scissors. This incision was extended laterally and the bladder flap was created digitally. The bladder blade was reinserted.  A low transverse hysterotomy was made with the scalpel until the endometrial cavity was breached and the amniotic sac ruptured with the Allis clamp, yielding clear amniotic fluid. This incision was extended bluntly and the infant's head, shoulders and body were delivered atraumatically.The cord was clamped x 2 and cut, and the infant was handed to the awaiting pediatricians, after delayed cord clamping was done.  The placenta was then gradually expressed from the uterus and then the uterus was exteriorized and cleared of all clots and debris. The hysterotomy was repaired with a running suture of 1-0 monocryl to achieve excellent hemostasis.   Using the Ligasure, a plane between the right paratubal cyst and the right fimbriae was serially cauterized and transected. The right Fallopian tube's mesosalpingx was then serially cauterized and transected until it was removed at  the cornua. The same procedure was then performed on the opposite side, with excellent hemostasis was noted from both BTL sites.  The uterus and adnexa were then  returned to the abdomen, and the hysterotomy and all operative sites were reinspected and excellent hemostasis was noted.   The peritoneum was closed with a running stitch of 3-0 Vicryl. The fascia was reapproximated with 0 Vicryl in a simple running fashion bilaterally. The subcutaneous layer was then reapproximated with interrupted sutures of 2-0 plain gut, and the skin was then closed with 4-0 monocryl, in a subcuticular fashion.  The patient  tolerated the procedure well. Sponge, lap, needle, and instrument counts were correct x 2. The patient was transferred to the recovery room awake, alert and breathing independently in stable condition.  I d/w the patient and partner in real time during the surgery re: the cyst and this was done with an inperson spanish interpreter.   Cornelia Copa MD Attending Center for [facility])

## 2023-08-29 NOTE — Anesthesia Procedure Notes (Addendum)
Spinal  Patient location during procedure: OB Start time: 08/29/2023 1:09 AM End time: 08/29/2023 10:23 AM Reason for block: surgical anesthesia Staffing Performed: anesthesiologist  Anesthesiologist: Trevor Iha, MD Performed by: Trevor Iha, MD Authorized by: Trevor Iha, MD   Preanesthetic Checklist Completed: patient identified, IV checked, risks and benefits discussed, surgical consent, monitors and equipment checked, pre-op evaluation and timeout performed Spinal Block Patient position: sitting Prep: DuraPrep and site prepped and draped Patient monitoring: heart rate, cardiac monitor, continuous pulse ox and blood pressure Approach: midline Location: L3-4 Injection technique: single-shot Needle Needle type: Pencan  Needle gauge: 24 G Needle length: 10 cm Needle insertion depth: 6 cm Assessment Sensory level: T4 Events: CSF return Additional Notes  2 Attempt (s). Pt tolerated procedure well.

## 2023-08-29 NOTE — Progress Notes (Signed)
Assisted Charlene Brooke RN with Interpretation, by Orlan Leavens Spanish Medical Interpreter.

## 2023-08-29 NOTE — H&P (Signed)
Obstetric Preoperative History and Physical  Dana Gibbs is a 42 y.o. [phone] with IUP at [redacted]w[redacted]d presenting for scheduled cesarean section.  Reports good fetal movement, no bleeding, no contractions, no leaking of fluid.  No acute preoperative concerns.    Cesarean Section Indication:  history of cesarean delivery x 2, plan for repeat; undesired fertility  Prenatal Course Source of Care: [facility]  with onset of care at 16 weeks Pregnancy complications or risks: Patient Active Problem List   Diagnosis Date Noted   History of cesarean delivery, currently pregnant 08/29/2023   Positive GBS test 08/15/2023   History of 2 cesarean sections 07/02/2023   Request for sterilization 07/02/2023   Language barrier 07/02/2023   Advanced maternal age in multigravida 04/20/2023   Supervision of high risk pregnancy, antepartum 03/22/2023   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   She plans on breast and formula feeding She desires bilateral tubal ligation for postpartum contraception.   Prenatal labs and studies: ABO, Rh: --/--/A POS (12/16 0920) Antibody: NEG (12/16 0920) Rubella: 1.32 (07/10 1307) RPR: NON REACTIVE (12/16 0914)  HBsAg: Negative (07/10 1307)  HIV: Non Reactive (10/17 0831)  AVW:UJWJXBJY/-- (11/26 1414) 2 hr Glucola  wnl Genetic screening normal Anatomy US normal  Prenatal Transfer Tool  Maternal Diabetes: No Genetic Screening: Normal Maternal Ultrasounds/Referrals: Normal Fetal Ultrasounds or other Referrals:  None Maternal Substance Abuse:  No Significant Maternal Medications:  None Significant Maternal Lab Results: Group B Strep positive  Past Medical History:  Diagnosis Date   Anemia    GERD (gastroesophageal reflux disease)    PONV (postoperative nausea and vomiting)     Past Surgical History:  Procedure Laterality Date   BREAST LUMPECTOMY WITH RADIOACTIVE SEED LOCALIZATION Right 11/30/2021   Procedure: RIGHT BREAST  LUMPECTOMY WITH RADIOACTIVE SEED LOCALIZATION;  Surgeon: Harriette Bouillon, MD;  Location: [facility];  Service: General;  Laterality: Right;   CESAREAN SECTION     x2   HEMORRHOID SURGERY      OB History  Gravida Para Term Preterm AB Living  3 2 2   2   SAB IAB Ectopic Multiple Live Births      2    # Outcome Date GA Lbr Len/2nd Weight Sex Type Anes PTL Lv  3 Current           2 Term 03/04/13     CS-LTranv   LIV  1 Term 09/22/06     CS-LTranv   LIV    Social History   Socioeconomic History   Marital status: Married    Spouse name: Norva Pavlov   Number of children: 2   Years of education: Not on file   Highest education level: High school graduate  Occupational History   Not on file  Tobacco Use   Smoking status: Never    Passive exposure: Never   Smokeless tobacco: Never  Vaping Use   Vaping status: Never Used  Substance and Sexual Activity   Alcohol use: Not Currently   Drug use: Never   Sexual activity: Yes  Other Topics Concern   Not on file  Social History Narrative   Not on file   Social Drivers of Health   Financial Resource Strain: Not on file  Food Insecurity: No Food Insecurity (08/29/2023)   Hunger Vital Sign    Worried About Running Out of Food in the Last Year: Never true    Ran Out of Food in the Last Year: Never true  Transportation Needs: No Transportation Needs (08/29/2023)   PRAPARE - Administrator, Civil Service (Medical): No    Lack of Transportation (Non-Medical): No  Physical Activity: Not on file  Stress: Not on file  Social Connections: Not on file    Family History  Problem Relation Age of Onset   Uterine cancer Mother    Kidney disease Father    Breast cancer Neg Hx     Medications Prior to Admission  Medication Sig Dispense Refill Last Dose/Taking   aspirin EC 81 MG tablet Take 1 tablet (81 mg total) by mouth daily. 60 tablet 2 08/28/2023   docusate sodium (COLACE) 100 MG capsule Take 1 capsule (100 mg  total) by mouth 2 (two) times daily as needed. 30 capsule 2 Past Week   ferrous sulfate 325 (65 FE) MG tablet Take 1 tablet (325 mg total) by mouth every other day. 90 tablet 1 08/28/2023   folic acid (FOLVITE) 400 MCG tablet Take 400 mcg by mouth daily.   08/28/2023   Prenatal Vit-Fe Fumarate-FA (PRENATAL COMPLETE) 14-0.4 MG TABS Take 1 tablet by mouth daily. 120 tablet 0 08/28/2023    No Known Allergies  Review of Systems: Pertinent items noted in HPI and remainder of comprehensive ROS otherwise negative.  Physical Exam: BP 116/76   Pulse 80   Temp 98.6 F (37 C) (Oral)   Resp 16   Ht 5\' 2"  (1.575 m)   Wt 78.9 kg   LMP 11/29/2022   SpO2 99%   BMI 31.81 kg/m  FHR by Doppler: 147 bpm CONSTITUTIONAL: Well-developed, well-nourished female in no acute distress.  HENT:  Normocephalic, atraumatic, External right and left ear normal. Oropharynx is clear and moist EYES: Conjunctivae and EOM are normal. Pupils are equal, round, and reactive to light. No scleral icterus.  NECK: Normal range of motion, supple, no masses SKIN: Skin is warm and dry. No rash noted. Not diaphoretic. No erythema. No pallor. NEUROLOGIC: Alert and oriented to person, place, and time. Normal reflexes, muscle tone coordination. No cranial nerve deficit noted. PSYCHIATRIC: Normal mood and affect. Normal behavior. Normal judgment and thought content. CARDIOVASCULAR: Normal heart rate noted, regular rhythm RESPIRATORY: Effort and breath sounds normal, no problems with respiration noted ABDOMEN: Soft, nontender, nondistended, gravid. Well-healed Pfannenstiel incision. PELVIC: Deferred MUSCULOSKELETAL: Normal range of motion. No edema and no tenderness. 2+ distal pulses.  Pertinent Labs/Studies:   Results for orders placed or performed during the hospital encounter of 08/28/23 (from the past 72 hours)  CBC     Status: Abnormal   Collection Time: 08/28/23  9:14 AM  Result Value Ref Range   WBC 5.6 4.0 - 10.5 K/uL    RBC 3.21 (L) 3.87 - 5.11 MIL/uL   Hemoglobin 8.9 (L) 12.0 - 15.0 g/dL   HCT 69.6 (L) 29.5 - 28.4 %   MCV 86.9 80.0 - 100.0 fL   MCH 27.7 26.0 - 34.0 pg   MCHC 31.9 30.0 - 36.0 g/dL   RDW 13.2 (H) 44.0 - 10.2 %   Platelets 262 150 - 400 K/uL   nRBC 0.0 0.0 - 0.2 %    Comment: Performed at [facility], [address] [place], [place] [address]  RPR     Status: None   Collection Time: 08/28/23  9:14 AM  Result Value Ref Range   RPR Ser Ql NON REACTIVE NON REACTIVE    Comment: Performed at [facility], [address] [place], [place] [address]  Type and screen     Status: None   Collection Time: 08/28/23  9:20 AM  Result Value Ref Range   ABO/RH(D) A POS    Antibody Screen NEG    Sample Expiration      08/31/2023,2359 Performed at [facility], [address], [place], [place] [address]     Assessment and Plan: Dana Gibbs is a 42 y.o. [id number] at [redacted]w[redacted]d being admitted for scheduled cesarean section. The risks of surgery were discussed with the patient including but were not limited to: bleeding which may require transfusion or reoperation; infection which may require antibiotics; injury to bowel, bladder, ureters or other surrounding organs; injury to the fetus; need for additional procedures including hysterectomy in the event of a life-threatening hemorrhage; formation of adhesions; placental abnormalities wth subsequent pregnancies; incisional problems; thromboembolic phenomenon and other postoperative/anesthesia complications. The patient concurred with the proposed plan, giving informed written consent for the procedure. Patient has been NPO since last night she will remain NPO for procedure. Anesthesia and OR aware. Preoperative prophylactic antibiotics and SCDs ordered on call to the OR. To OR when ready.   Patient desires permanent sterilization.  Other reversible forms of contraception were discussed with patient; she declines all  other modalities. Risks of procedure discussed with patient including but not limited to: risk of regret, permanence of method, bleeding, infection, injury to surrounding organs and need for additional procedures.  Failure risk of about 1% with increased risk of ectopic gestation if pregnancy occurs was also discussed with patient.  Also discussed possibility of post-tubal pain syndrome. Patient verbalized understanding of these risks and wants to proceed with sterilization.  Written informed consent obtained.  To OR when ready.   Sundra Aland, MD FMOB Fellow, Faculty practice [facility], Center for [facility] 08/29/23  9:23 AM

## 2023-08-29 NOTE — Discharge Summary (Signed)
Postpartum Discharge Summary (Visit w [facility] interpreter [place] #[id number])     Patient Name: Dana Gibbs DOB: 1981/06/15 MRN: [id number]  Date of admission: 08/29/2023 Delivery date:08/29/2023 Delivering provider: [place] Bing Date of discharge: 08/31/2023  Admitting diagnosis: Pregnancy at 39/0. Scheduled rpt c-section & BTL. Anemia Intrauterine pregnancy: [redacted]w[redacted]d     Secondary diagnosis:  Principal Problem:   S/P cesarean section Active Problems:   Advanced maternal age in multigravida   Request for sterilization   Language barrier   Positive GBS test   History of cesarean delivery, currently pregnant   Postoperative anemia due to acute blood loss    Discharge diagnosis: Term Pregnancy Delivered and Anemia                                              Post partum procedures:blood transfusion Augmentation: N/A Complications: None  Hospital course: She underwent a RLTCS, BS and removal of 5cm complex right paratubal cyst on day of admission; details of operation can be found in separate operative note.  Patient had a postpartum course complicated by a POD#1 hgb of 5.6 (8.9 on admit); she rec'd 2u PRBC with f/u hgb 9.0 then 8.6 on POD#2.  She is ambulating, tolerating a regular diet, passing flatus, and urinating well. Patient is discharged home in stable condition on  08/31/23 per her request for early d/c.        Newborn Data: Birth date:08/29/2023 Birth time:10:48 AM Gender:Female Living status:Living Apgars:9 ,9  Weight:3190 g (7lb 0.5oz)   Magnesium Sulfate received: No BMZ received: No Rhophylac:N/A MMR:N/A T-DaP:Given prenatally Flu: Yes RSV Vaccine received: No Transfusion:Yes Immunizations administered:  There is no immunization history on file for this patient.  Physical exam  Vitals:   08/30/23 1215 08/30/23 1452 08/30/23 2336 08/31/23 0511  BP: 90/62 (!) 96/56 (!) 101/57 (!) 104/47  Pulse: 62 61 (!) 53 (!) 51  Resp: 18 18  16 17   Temp: 98 F (36.7 C) 98.5 F (36.9 C) 97.8 F (36.6 C) 98.5 F (36.9 C)  TempSrc: Oral Oral    SpO2: 100% 99%    Weight:      Height:       General: alert and cooperative Lochia: appropriate Uterine Fundus: firm Incision: pressure dsg intact and dry DVT Evaluation: No evidence of DVT seen on physical exam. Labs: Lab Results  Component Value Date   WBC 8.0 08/31/2023   HGB 8.6 (L) 08/31/2023   HCT 26.1 (L) 08/31/2023   MCV 85.9 08/31/2023   PLT 215 08/31/2023      Latest Ref Rng & Units 08/29/2023    2:03 PM  CMP  Creatinine 0.44 - 1.00 mg/dL 9.14    Edinburgh Score:     No data to display            After visit meds:  Allergies as of 08/31/2023   No Known Allergies      Medication List     STOP taking these medications    aspirin EC 81 MG tablet   folic acid 400 MCG tablet Commonly known as: FOLVITE       TAKE these medications    docusate sodium 100 MG capsule Commonly known as: COLACE Take 1 capsule (100 mg total) by mouth 2 (two) times daily as needed.   ferrous sulfate 325 (65 FE) MG  tablet Take 1 tablet (325 mg total) by mouth every other day.   ibuprofen 600 MG tablet Commonly known as: ADVIL Take 1 tablet (600 mg total) by mouth every 6 (six) hours as needed.   oxyCODONE 5 MG immediate release tablet Commonly known as: Oxy IR/ROXICODONE Take 1-2 tablets (5-10 mg total) by mouth every 6 (six) hours as needed for moderate pain (pain score 4-6).   Prenatal Complete 14-0.4 MG Tabs Take 1 tablet by mouth daily.         Discharge home in stable condition Infant Feeding: Bottle and Breast Infant Disposition:home with mother Discharge instruction: per After Visit Summary and Postpartum booklet. Activity: Advance as tolerated. Pelvic rest for 6 weeks.  Diet: routine diet Anticipated Birth Control: BTL done PP Postpartum Appointment:4 weeks Additional Postpartum F/U: Incision check 1 week Future Appointments: Future  Appointments  Date Time Provider Department Center  09/08/2023 10:20 AM [facility] NURSE [facility] None  09/26/2023  1:10 PM Brock Bad, MD [facility] None   Follow up Visit:  Message sent to [facility] 12/17     08/31/2023 [name], CNM 9:14 AM

## 2023-08-30 LAB — PREPARE RBC (CROSSMATCH)

## 2023-08-30 LAB — CBC
HCT: 17.2 % — ABNORMAL LOW (ref 36.0–46.0)
HCT: 26.8 % — ABNORMAL LOW (ref 36.0–46.0)
Hemoglobin: 5.6 g/dL — CL (ref 12.0–15.0)
Hemoglobin: 9 g/dL — ABNORMAL LOW (ref 12.0–15.0)
MCH: 28.4 pg (ref 26.0–34.0)
MCH: 28.5 pg (ref 26.0–34.0)
MCHC: 32.6 g/dL (ref 30.0–36.0)
MCHC: 33.6 g/dL (ref 30.0–36.0)
MCV: 87.3 fL (ref 80.0–100.0)
MCV: 84.8 fL (ref 80.0–100.0)
Platelets: 154 10*3/uL (ref 150–400)
Platelets: 213 10*3/uL (ref 150–400)
RBC: 1.97 MIL/uL — ABNORMAL LOW (ref 3.87–5.11)
RBC: 3.16 MIL/uL — ABNORMAL LOW (ref 3.87–5.11)
RDW: 15.7 % — ABNORMAL HIGH (ref 11.5–15.5)
RDW: 15.3 % (ref 11.5–15.5)
WBC: 6.9 10*3/uL (ref 4.0–10.5)
WBC: 9.1 10*3/uL (ref 4.0–10.5)
nRBC: 0 % (ref 0.0–0.2)
nRBC: 0 % (ref 0.0–0.2)

## 2023-08-30 MED ORDER — SODIUM CHLORIDE 0.9% IV SOLUTION: Freq: Once | INTRAVENOUS | Status: DC

## 2023-08-30 MED ORDER — LACTATED RINGERS IV BOLUS: 500.0000 mL | Freq: Once | INTRAVENOUS | Status: DC

## 2023-08-30 NOTE — Progress Notes (Signed)
Ordered pt meals, assisted Gabi LPN, with explanation on blood consent by Orlan Leavens Spanish Medical Interpreter.

## 2023-08-30 NOTE — Lactation Note (Signed)
This note was copied from a baby's chart. Lactation Consultation Note  Patient Name: Dana Gibbs Today's Date: 08/30/2023 Age:42 hours Reason for consult: Follow-up assessment;Term In-house Spanish Interpreter used # [place].  P3, term female infant, Per  MOB, she is feeling much better and  is latching infant to breast,  the most recently feedings this afternoon,  she has latch infant  ( three times to  breast first )and then supplemented with formula afterwards, this is her feeding choice. Per MOB, since 0100 am infant had 3 voids and 2 stools today.   MOB knows if infant latches 1st afterwards she plans to supplement infant with ( 7-12 mls) of formula or more if he wants it but if not latch she will offer (15-30 mls) of formula. Spanish Interpreter written her plan on [place] board regarding  volume amounts. It is MOB, choice to supplement infant, she did this with her previous two children.   MOB knows to call for latch assistance if needed.  Maternal Data    Feeding Mother's Current Feeding Choice: Breast Milk and Formula  LATCH Score  MOB finished latching infant at the breast at 1550 pm just prior to [name] entering the room.                   Lactation Tools Discussed/Used    Interventions Interventions: Skin to skin;Position options;Education;Pace feeding  Discharge    Consult Status Consult Status: Follow-up Date: 08/31/23 Follow-up type: In-patient    Frederico Hamman 08/30/2023, 4:34 PM

## 2023-08-30 NOTE — Progress Notes (Signed)
POSTPARTUM PROGRESS NOTE  POD #1  Subjective:  Byrd Hesselbach del Transito katoya oblander is a 42 y.o. [phone] s/p rLTCS at [redacted]w[redacted]d.  No acute events overnight. She reports she is doing well. She denies any problems with ambulating, voiding or po intake. Denies nausea or vomiting. She has not yet passed flatus. Pain is well controlled.  Lochia is appropriate.  Objective: BP (!) 80/62   Pulse 65   Temp 97.6 F (36.4 C) (Oral)   Resp 18   Ht 5\' 2"  (1.575 m)   Wt 78.9 kg   LMP 11/29/2022   SpO2 100%   Breastfeeding Unknown   BMI 31.81 kg/m   Physical Exam:  General: alert, cooperative and no distress Chest: no respiratory distress Heart:regular rate, distal pulses intact Abdomen: soft, nontender,  Uterine Fundus: firm, appropriately tender DVT Evaluation: No calf swelling or tenderness Skin: warm, dry; incision clean/dry/intact w/ pressure dressing in place  Recent Labs    08/28/23 0914 08/30/23 0532  HGB 8.9* 5.6*  HCT 27.9* 17.2*    Assessment/Plan: Byrd Hesselbach del Transito tahjai brockhaus is a 42 y.o. [phone] s/p RLTCS and BTS with removal of 5 cm paratubal cyst at [redacted]w[redacted]d for elective repeat.  POD#1 - Doing welll; pain well controlled. H/H appropriate  Routine postpartum care  OOB, ambulated  Lovenox for VTE prophylaxis Anemia: significant hemoglobin drop from 8.9>5.6, receiving 2u pRBC. EBL only 500 cc for surgery so somewhat excessive of a drop but very well appearing on exam with benign abdomen, low suspicion for ongoing bleeding. Plan to recheck CBC 4h after last transfusion. Contraception: s/p BTS Feeding: bottle and breast  Dispo: Plan for discharge POD#2.   LOS: 1 day   Venora Maples, MD/MPH Attending Family Medicine Physician, [facility] for [facility]  08/30/2023, 10:06 AM

## 2023-08-30 NOTE — Progress Notes (Signed)
CSW was consulted for a carseat and met MOB at bedside to offer support. CSW entered the room, introduced herself with [employer] health care interpretor Jhon [phone] and explained the reason for the visit.  CSW asked MOB about still needing a carseat and MOB said yes. CSW explained to MOB about the 30 dollar cost associated with the carseat. FOB was understanding; and he reported that he had the exact amount of money.  CSW delivered the carseat to MOB at bedside.   CSW identifies no further need for intervention and no barriers to discharge at this time.  Enos Fling, Theresia Majors Clinical Social Worker [phone]

## 2023-08-31 ENCOUNTER — Ambulatory Visit (HOSPITAL_COMMUNITY): Payer: Self-pay

## 2023-08-31 ENCOUNTER — Other Ambulatory Visit: Payer: Self-pay | Admitting: Family Medicine

## 2023-08-31 DIAGNOSIS — Z98891 History of uterine scar from previous surgery: Secondary | ICD-10-CM

## 2023-08-31 DIAGNOSIS — D62 Acute posthemorrhagic anemia: Secondary | ICD-10-CM | POA: Diagnosis not present

## 2023-08-31 LAB — BPAM RBC
Blood Product Expiration Date: 202501062359
Blood Product Expiration Date: 202501062359
ISSUE DATE / TIME: 202412180754
ISSUE DATE / TIME: 202412181007
Unit Type and Rh: 6200
Unit Type and Rh: 6200

## 2023-08-31 LAB — CBC
HCT: 26.1 % — ABNORMAL LOW (ref 36.0–46.0)
Hemoglobin: 8.6 g/dL — ABNORMAL LOW (ref 12.0–15.0)
MCH: 28.3 pg (ref 26.0–34.0)
MCHC: 33 g/dL (ref 30.0–36.0)
MCV: 85.9 fL (ref 80.0–100.0)
Platelets: 215 10*3/uL (ref 150–400)
RBC: 3.04 MIL/uL — ABNORMAL LOW (ref 3.87–5.11)
RDW: 16.1 % — ABNORMAL HIGH (ref 11.5–15.5)
WBC: 8 10*3/uL (ref 4.0–10.5)
nRBC: 0 % (ref 0.0–0.2)

## 2023-08-31 LAB — TYPE AND SCREEN
ABO/RH(D): A POS
Antibody Screen: NEGATIVE
Unit division: 0
Unit division: 0

## 2023-08-31 MED ORDER — IBUPROFEN 600 MG PO TABS: 600.0000 mg | ORAL_TABLET | Freq: Four times a day (QID) | ORAL | 0 refills | Status: AC | PRN

## 2023-08-31 MED ORDER — OXYCODONE HCL 5 MG PO TABS: 5.0000 mg | ORAL_TABLET | Freq: Four times a day (QID) | ORAL | 0 refills | Status: DC | PRN

## 2023-08-31 MED ORDER — OXYCODONE HCL 5 MG PO TABS: 5.0000 mg | ORAL_TABLET | Freq: Four times a day (QID) | ORAL | 0 refills | Status: AC | PRN

## 2023-08-31 NOTE — Lactation Note (Signed)
This note was copied from a baby's chart. Lactation Consultation Note  Patient Name: Boy Byrd Hesselbach del Transito alfretta enberg Today's Date: 08/31/2023 Lorayne Marek [facility] Spanish interpreter - present  LC reviewed BF D/C teaching and the [facility] resources.  Per mom plans to BF and formula feed.  Lc reviewed Engorgement prevention and tx . Hand pump provided and [place] #21  Interventions    Discharge    Consult Status  Complete     Matilde Sprang Leondra Cullin 08/31/2023, 7:46 PM

## 2023-09-01 LAB — SURGICAL PATHOLOGY

## 2023-09-02 IMAGING — MG DIGITAL DIAGNOSTIC BILAT W/ TOMO W/ CAD
8 of 15 series · 8 of 40 positions shown · non-contrast
Comparison: None.

CLINICAL DATA: Patient presents for diffuse bilateral breast
tenderness.

EXAM:
DIGITAL DIAGNOSTIC BILATERAL MAMMOGRAM WITH TOMOSYNTHESIS AND CAD;
ULTRASOUND RIGHT BREAST LIMITED
TECHNIQUE: Bilateral digital diagnostic mammography and breast tomosynthesis
was performed. The images were evaluated with computer-aided
detection.; Targeted ultrasound examination of the right breast was
performed

[R CC synth-2D (1 of 2)]
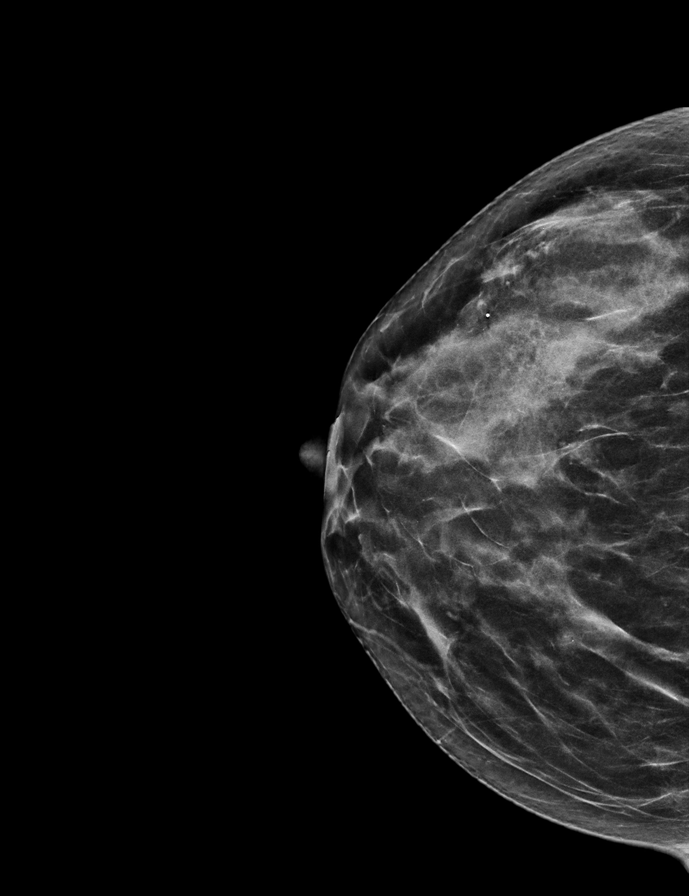

[R MLO synth-2D (1 of 3)]
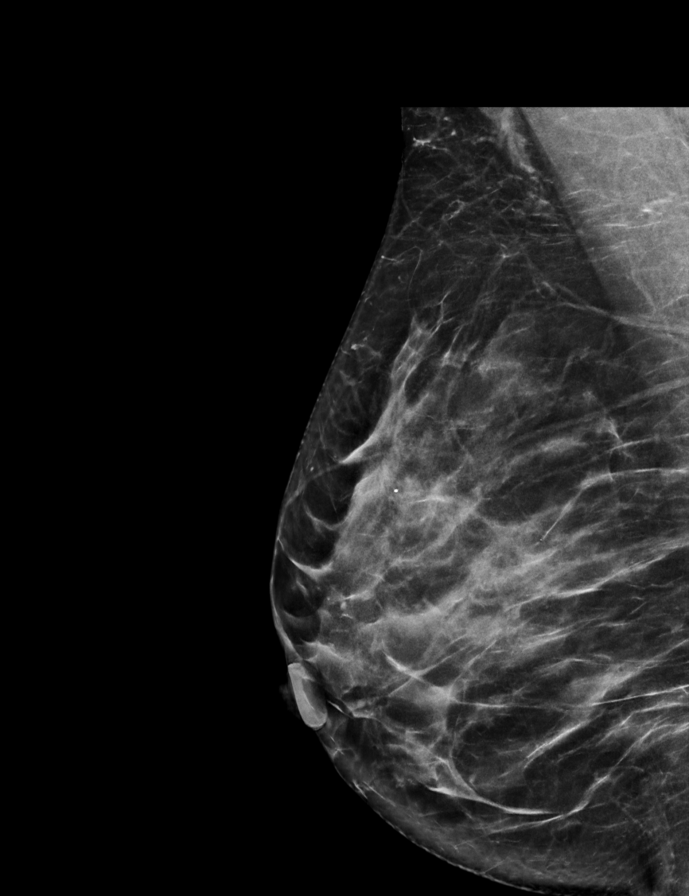

[R CC synth-2D (2 of 2)]
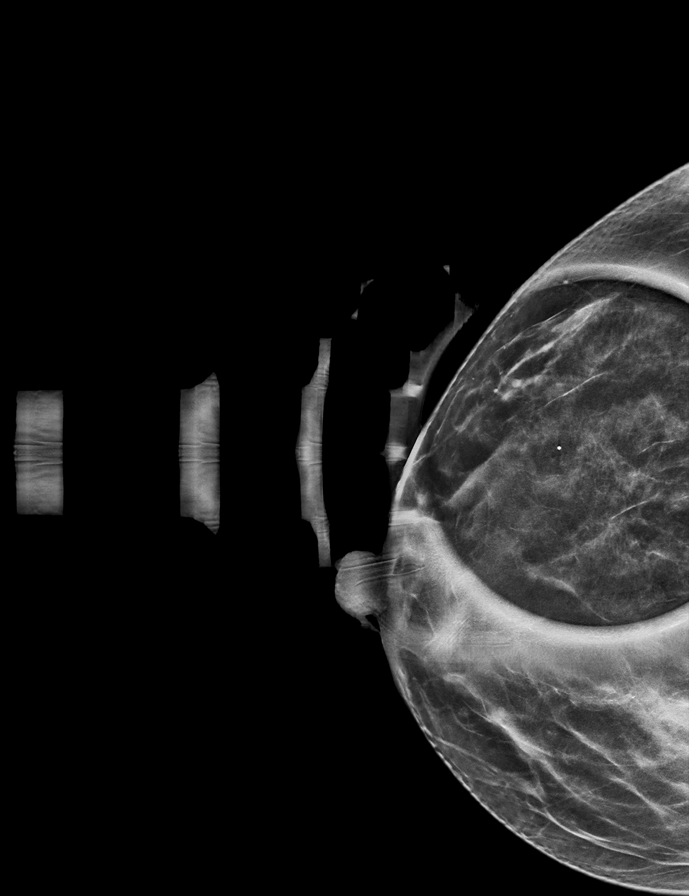

[R MLO synth-2D (2 of 3)]
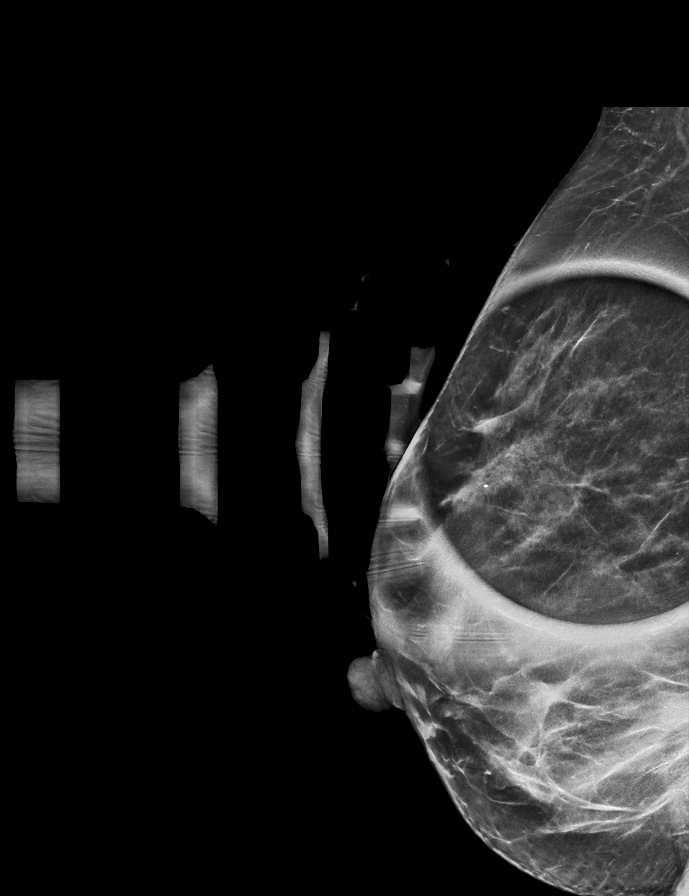

[R MLO synth-2D (3 of 3)]
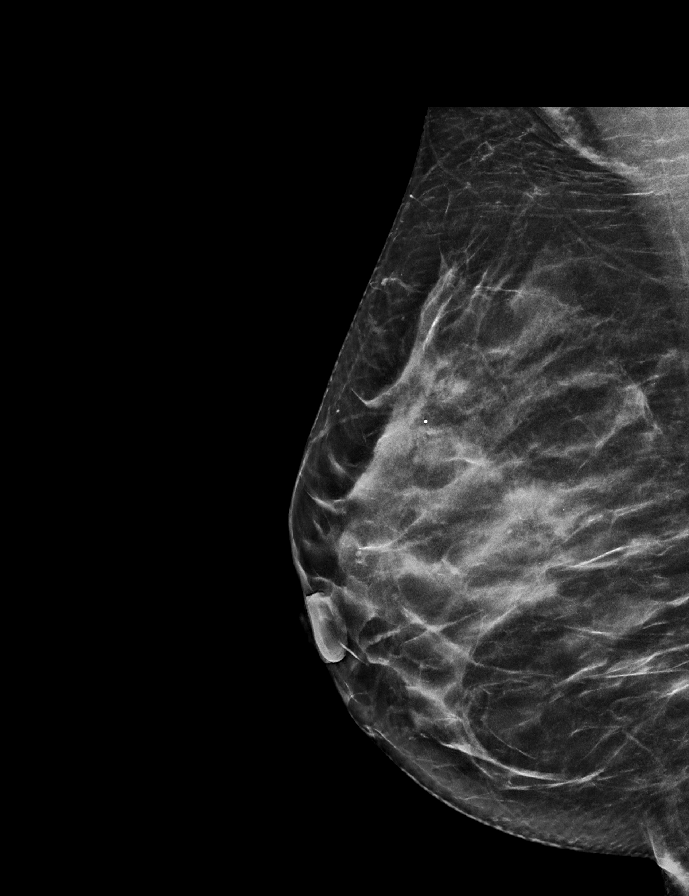

[L CC synth-2D]
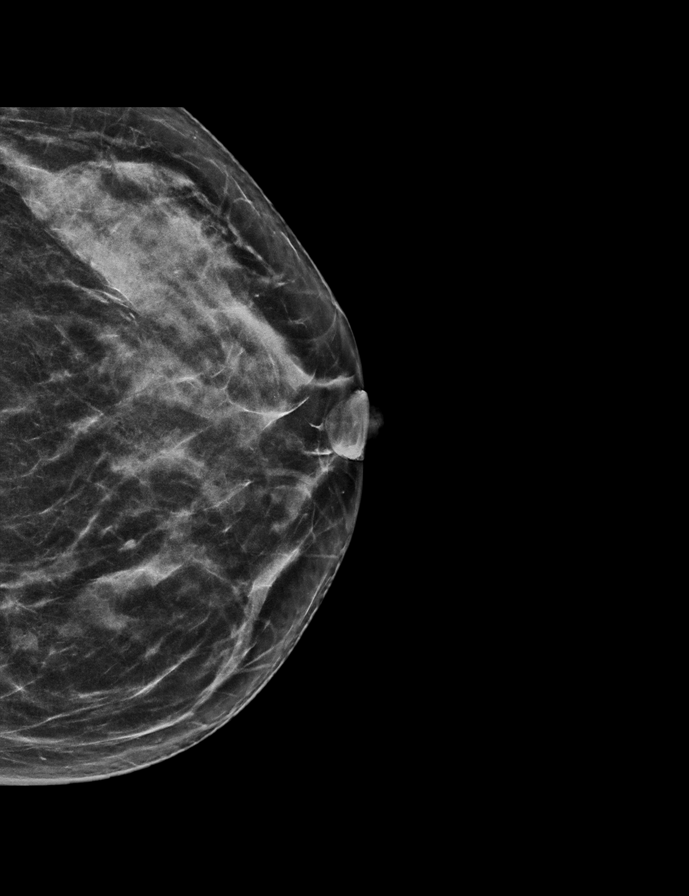

[L MLO synth-2D]
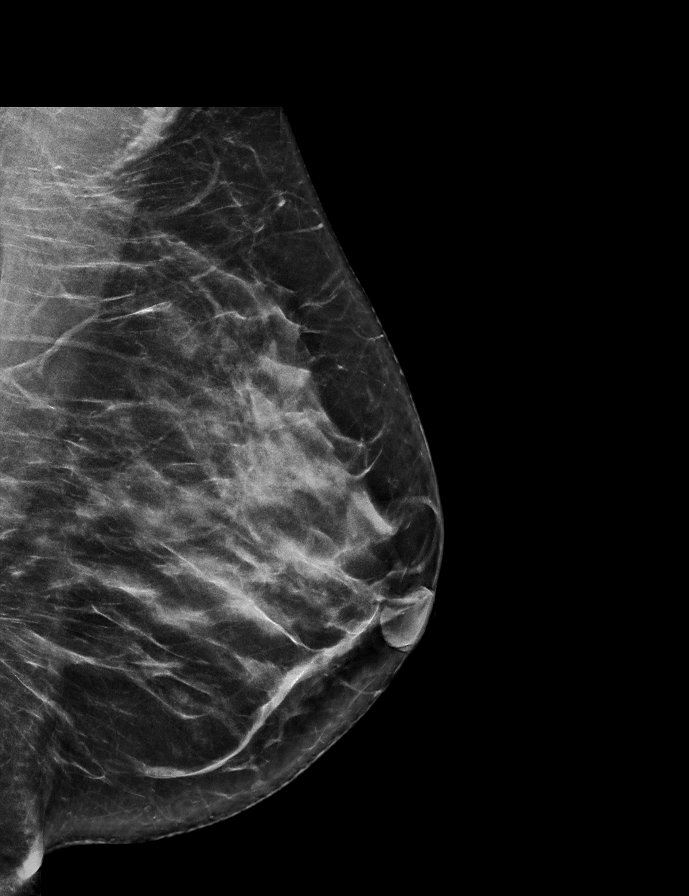

[R MLO tomo · tomo slice 38/55.0]
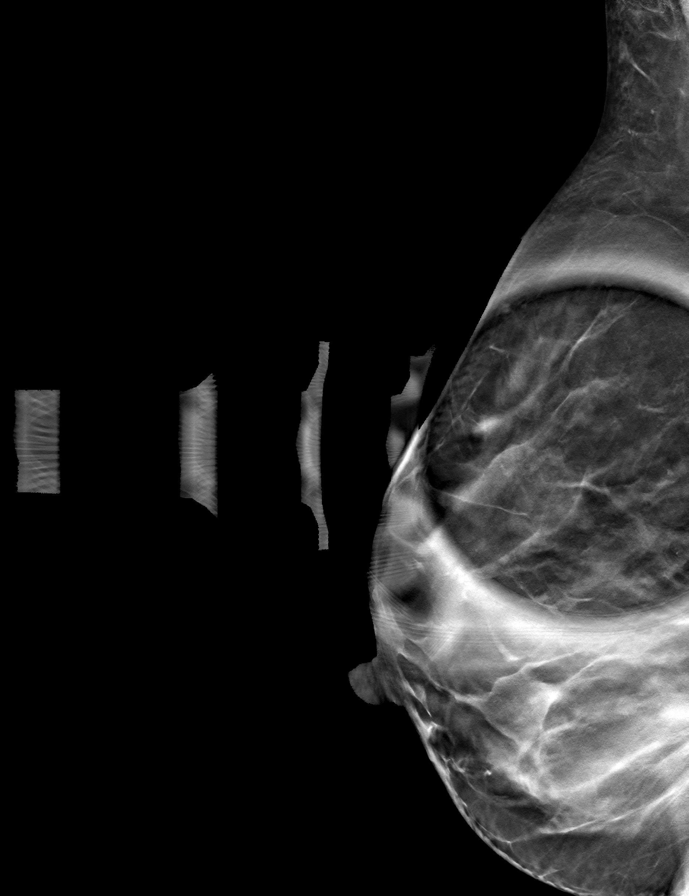

[8 of 40 positions shown; findings below may reference images not displayed]

ACR Breast Density Category c: The breast tissue is heterogeneously
dense, which may obscure small masses.
FINDINGS: Within the upper-outer right breast middle depth there is persistent
subtle distortion, further evaluated with additional imaging. No
additional concerning findings within either breast.

On physical exam, dense tissue is palpated upper-outer right breast.

Targeted ultrasound is performed, showing no sonographic correlate
for the subtle distortion within the upper-outer right breast. No
right axillary adenopathy.
IMPRESSION: Persistent subtle distortion upper-outer right breast.

RECOMMENDATION:
Stereotactic guided core needle biopsy subtle distortion upper-outer
right breast.

I have discussed the findings and recommendations with the patient.
If applicable, a reminder letter will be sent to the patient
regarding the next appointment.

BI-RADS CATEGORY  4: Suspicious.

## 2023-09-02 IMAGING — US US BREAST*R* LIMITED INC AXILLA
1 series · 5 of 5 positions shown · non-contrast
Comparison: None.

CLINICAL DATA: Patient presents for diffuse bilateral breast
tenderness.

EXAM:
DIGITAL DIAGNOSTIC BILATERAL MAMMOGRAM WITH TOMOSYNTHESIS AND CAD;
ULTRASOUND RIGHT BREAST LIMITED
TECHNIQUE: Bilateral digital diagnostic mammography and breast tomosynthesis
was performed. The images were evaluated with computer-aided
detection.; Targeted ultrasound examination of the right breast was
performed

[Series 1: us breast*right* limited inc axilla · 0.06mm/px · 5 of 5 slices shown]
[im 1/5]
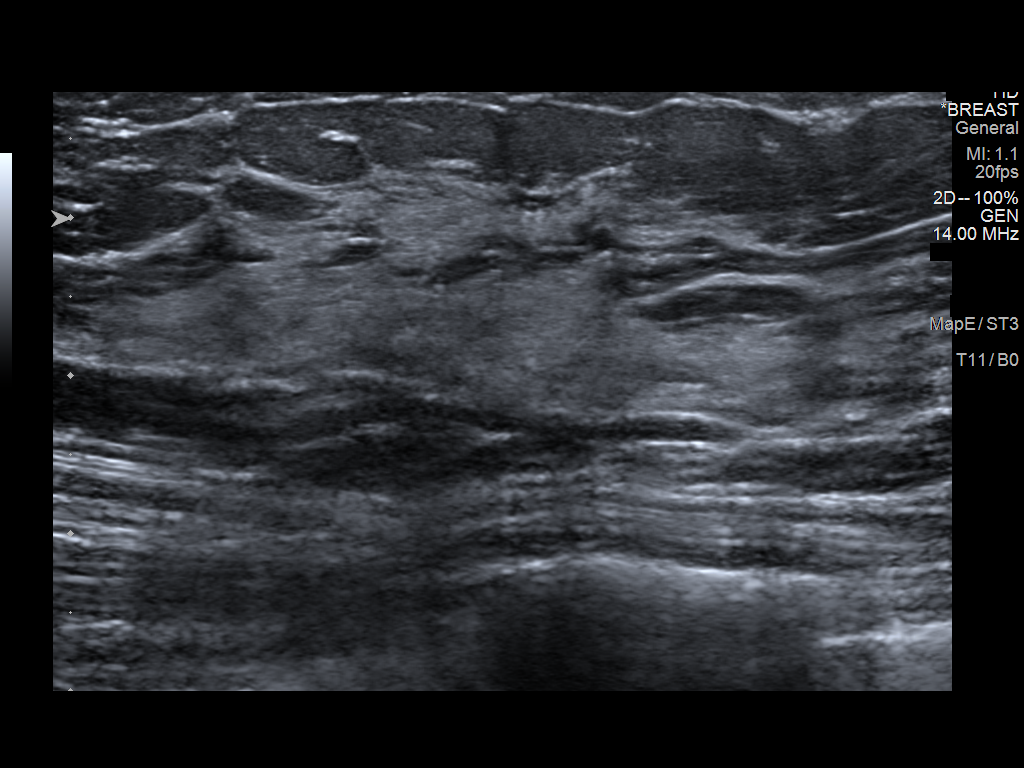
[im 2/5]
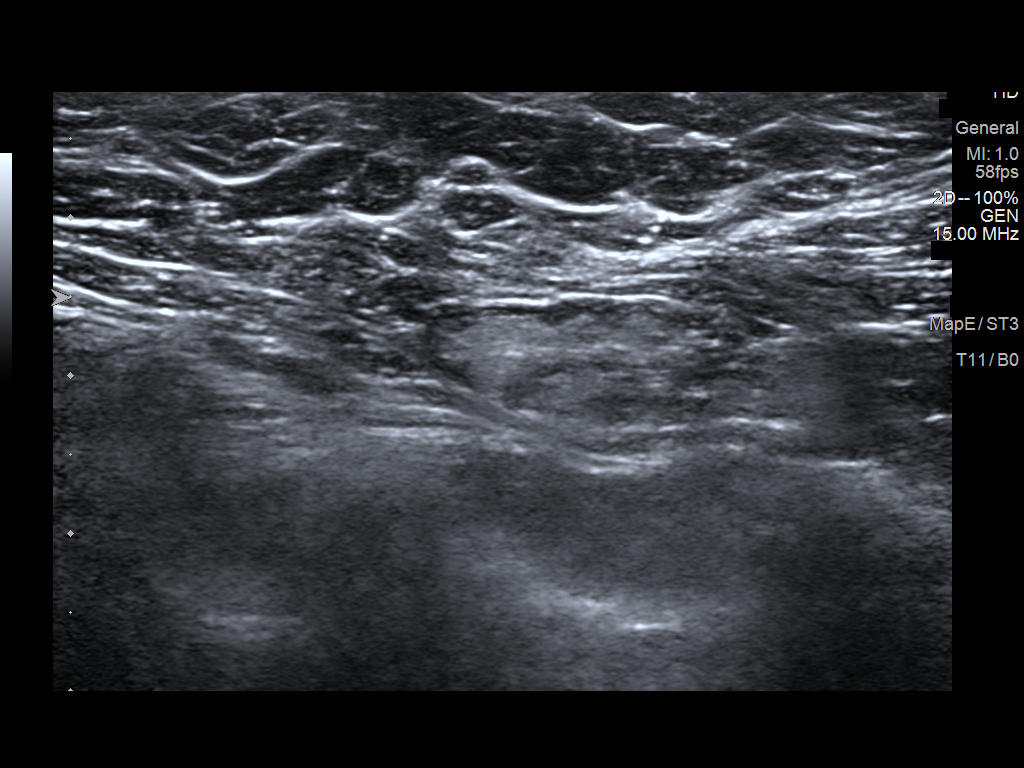
[im 3/5]
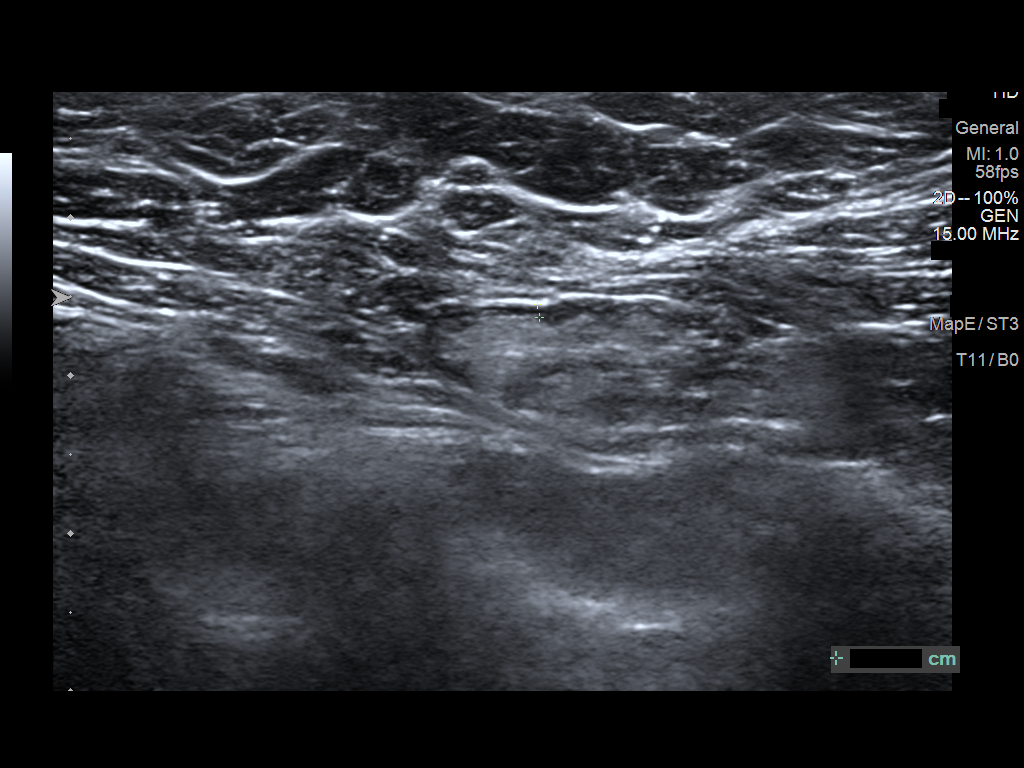
[im 4/5]
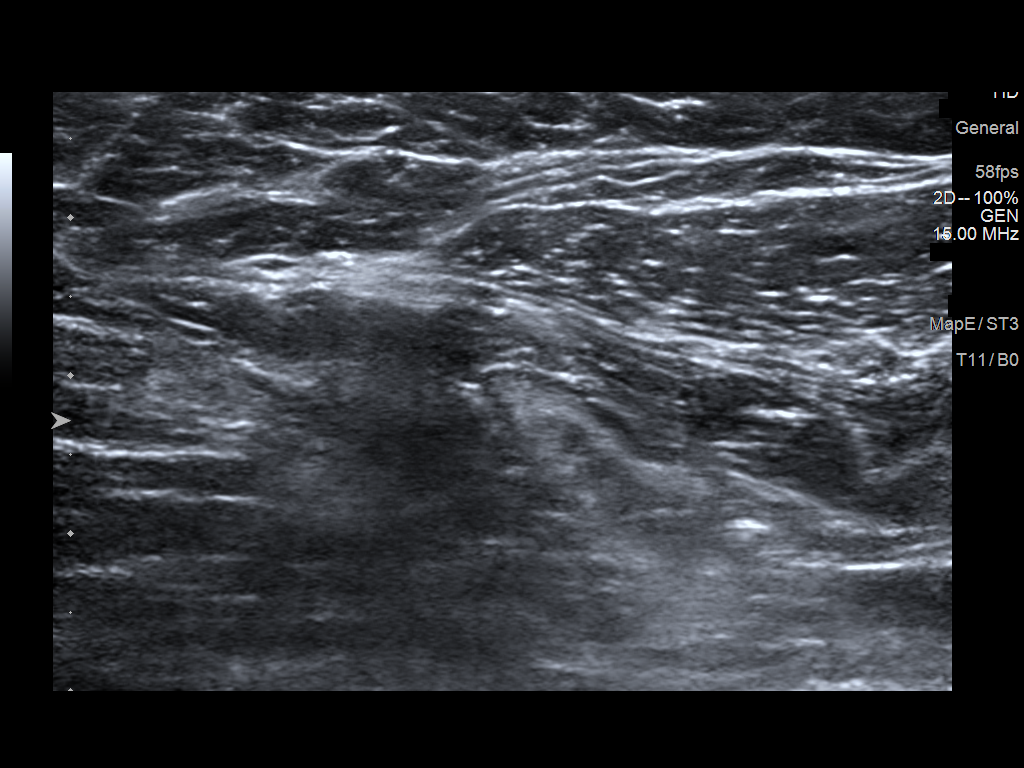
[im 5/5]
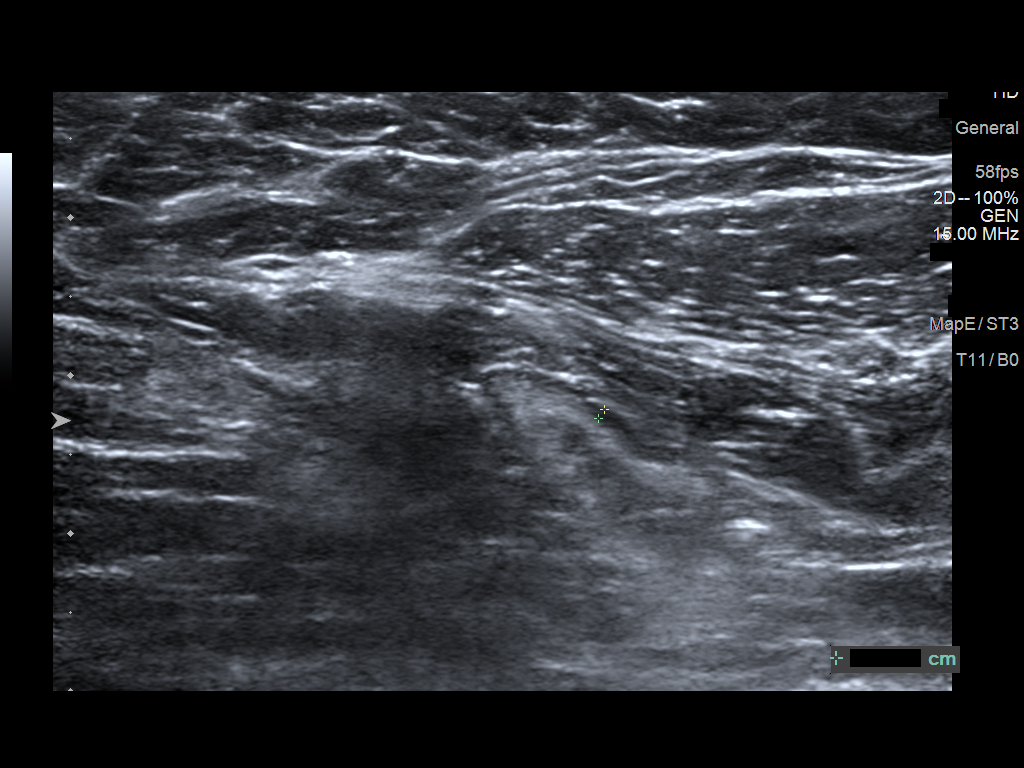

[5 of 5 positions shown; findings below may reference images not displayed]

ACR Breast Density Category c: The breast tissue is heterogeneously
dense, which may obscure small masses.
FINDINGS: Within the upper-outer right breast middle depth there is persistent
subtle distortion, further evaluated with additional imaging. No
additional concerning findings within either breast.

On physical exam, dense tissue is palpated upper-outer right breast.

Targeted ultrasound is performed, showing no sonographic correlate
for the subtle distortion within the upper-outer right breast. No
right axillary adenopathy.
IMPRESSION: Persistent subtle distortion upper-outer right breast.

RECOMMENDATION:
Stereotactic guided core needle biopsy subtle distortion upper-outer
right breast.

I have discussed the findings and recommendations with the patient.
If applicable, a reminder letter will be sent to the patient
regarding the next appointment.

BI-RADS CATEGORY  4: Suspicious.

## 2023-09-04 ENCOUNTER — Encounter: Payer: Self-pay | Admitting: Obstetrics and Gynecology

## 2023-09-05 ENCOUNTER — Telehealth (HOSPITAL_COMMUNITY): Payer: Self-pay | Admitting: *Deleted

## 2023-09-05 NOTE — Telephone Encounter (Signed)
09/05/2023  Name: Dana Gibbs MRN: [id number] DOB: 04/22/1981  Reason for Call:  Transition of Care Hospital Discharge Call  Contact Status: Patient Contact Status: Complete  Language assistant needed: Interpreter Mode: Telephonic Interpreter Interpreter Name: Delrae Sawyers [phone] Interpreter Phone Number - If applicable: Language Line [phone]        Follow-Up Questions: Do You Have Any Concerns About Your Health As You Heal From Delivery?: Yes What Concerns Do You Have About Your Health?: Patient c/o pain with urination. RN reviewed signs of UTI to report to MD. Patient stated, "Yes. I have all of those." RN instructed patient to contact her physician to discuss symptoms. Patient verbalized understanding and voiced plan to call her provider. No other questions or concerns voiced at this time. Do You Have Any Concerns About Your Infants Health?: Yes What Concerns Do You Have About Your Baby?: Patient reported that she has introduced formula to baby. Asked what formula she could switch baby to because "he isnt' tolerating it." Patient reported infant is constipated. Upon further questioning, patient reported infant having stool with normal consistency, frequency, and color. RN referred patient to pediatrician for further questions regarding formula recommendations for infant. Patient also inquired about infant's umbilical cord. RN reviewed signs to report to MD. None reported. No further questions or concerns voiced at this time.  Edinburgh Postnatal Depression Scale:  In the Past 7 Days: I have been able to laugh and see the funny side of things.: As much as I always could I have looked forward with enjoyment to things.: As much as I ever did I have blamed myself unnecessarily when things went wrong.: No, never I have been anxious or worried for no good reason.: No, not at all I have felt scared or panicky for no good reason.: No, not at all Things have been  getting on top of me.: No, most of the time I have coped quite well I have been so unhappy that I have had difficulty sleeping.: Not at all I have felt sad or miserable.: No, not at all I have been so unhappy that I have been crying.: No, never The thought of harming myself has occurred to me.: Never Edinburgh Postnatal Depression Scale Total: 1  PHQ2-9 Depression Scale:     Discharge Follow-up: Edinburgh score requires follow up?: No Patient referred to:: OB, Peds  Post-discharge interventions: Reviewed Newborn Safe Sleep Practices  Signature Deforest Hoyles, RN, 09/05/23, 1014

## 2023-09-07 ENCOUNTER — Other Ambulatory Visit: Payer: Self-pay

## 2023-09-07 ENCOUNTER — Inpatient Hospital Stay (HOSPITAL_COMMUNITY)
Admission: EM | Admit: 2023-09-07 | Discharge: 2023-09-07 | Disposition: A | Discharge status: Home / Self Care | Payer: Medicaid Other | Attending: Family Medicine | Admitting: Family Medicine

## 2023-09-07 DIAGNOSIS — N390 Urinary tract infection, site not specified: Secondary | ICD-10-CM | POA: Insufficient documentation

## 2023-09-07 DIAGNOSIS — R319 Hematuria, unspecified: Secondary | ICD-10-CM | POA: Insufficient documentation

## 2023-09-07 DIAGNOSIS — L7682 Other postprocedural complications of skin and subcutaneous tissue: Secondary | ICD-10-CM | POA: Diagnosis not present

## 2023-09-07 DIAGNOSIS — Z79899 Other long term (current) drug therapy: Secondary | ICD-10-CM | POA: Insufficient documentation

## 2023-09-07 DIAGNOSIS — R030 Elevated blood-pressure reading, without diagnosis of hypertension: Secondary | ICD-10-CM | POA: Insufficient documentation

## 2023-09-07 DIAGNOSIS — Z98891 History of uterine scar from previous surgery: Secondary | ICD-10-CM | POA: Insufficient documentation

## 2023-09-07 DIAGNOSIS — R3589 Other polyuria: Secondary | ICD-10-CM | POA: Insufficient documentation

## 2023-09-07 LAB — COMPREHENSIVE METABOLIC PANEL
ALT: 23 U/L (ref 0–44)
AST: 17 U/L (ref 15–41)
Albumin: 2.8 g/dL — ABNORMAL LOW (ref 3.5–5.0)
Alkaline Phosphatase: 77 U/L (ref 38–126)
Anion gap: 10 (ref 5–15)
BUN: 9 mg/dL (ref 6–20)
CO2: 22 mmol/L (ref 22–32)
Calcium: 9.3 mg/dL (ref 8.9–10.3)
Chloride: 107 mmol/L (ref 98–111)
Creatinine, Ser: 0.57 mg/dL (ref 0.44–1.00)
GFR, Estimated: >60 mL/min (ref >60)
Glucose, Bld: 99 mg/dL (ref 70–99)
Potassium: 3.5 mmol/L (ref 3.5–5.1)
Sodium: 139 mmol/L (ref 135–145)
Total Bilirubin: 0.7 mg/dL (ref <1.2)
Total Protein: 6.5 g/dL (ref 6.5–8.1)

## 2023-09-07 LAB — URINALYSIS, ROUTINE W REFLEX MICROSCOPIC
Bilirubin Urine: NEGATIVE
Glucose, UA: NEGATIVE mg/dL
Ketones, ur: NEGATIVE mg/dL
Nitrite: NEGATIVE
Protein, ur: 100 mg/dL — AB
RBC / HPF: >50 RBC/hpf (ref 0–5)
Specific Gravity, Urine: 1.008 (ref 1.005–1.030)
WBC, UA: >50 WBC/hpf (ref 0–5)
pH: 7 (ref 5.0–8.0)

## 2023-09-07 LAB — CBC
HCT: 35.9 % — ABNORMAL LOW (ref 36.0–46.0)
Hemoglobin: 11.4 g/dL — ABNORMAL LOW (ref 12.0–15.0)
MCH: 27.9 pg (ref 26.0–34.0)
MCHC: 31.8 g/dL (ref 30.0–36.0)
MCV: 87.8 fL (ref 80.0–100.0)
Platelets: 396 10*3/uL (ref 150–400)
RBC: 4.09 MIL/uL (ref 3.87–5.11)
RDW: 15.4 % (ref 11.5–15.5)
WBC: 5 10*3/uL (ref 4.0–10.5)
nRBC: 0 % (ref 0.0–0.2)

## 2023-09-07 MED ORDER — NIFEDIPINE ER OSMOTIC RELEASE 30 MG PO TB24: 30.0000 mg | ORAL_TABLET | Freq: Every day | ORAL | 11 refills | Status: AC

## 2023-09-07 MED ORDER — FUROSEMIDE 20 MG PO TABS: 20.0000 mg | ORAL_TABLET | Freq: Every day | ORAL | 0 refills | Status: AC

## 2023-09-07 MED ORDER — ACETAMINOPHEN 500 MG PO TABS
1000.0000 mg | ORAL_TABLET | Freq: Four times a day (QID) | ORAL | Status: DC | PRN
  Administered 2023-09-07: 1000 mg via ORAL
  Filled 2023-09-07: qty 2

## 2023-09-07 MED ORDER — ACETAMINOPHEN 500 MG PO TABS: 1000.0000 mg | ORAL_TABLET | Freq: Four times a day (QID) | ORAL | 0 refills | Status: AC | PRN

## 2023-09-07 MED ORDER — NITROFURANTOIN MONOHYD MACRO 100 MG PO CAPS: 100.0000 mg | ORAL_CAPSULE | Freq: Two times a day (BID) | ORAL | 0 refills | Status: AC

## 2023-09-07 MED ORDER — NITROFURANTOIN MONOHYD MACRO 100 MG PO CAPS
100.0000 mg | ORAL_CAPSULE | Freq: Once | ORAL | Status: AC
Start: 2023-09-07 — End: 2023-09-07
  Administered 2023-09-07: 100 mg via ORAL
  Filled 2023-09-07: qty 1

## 2023-09-07 NOTE — Discharge Instructions (Addendum)
It was a pleasure taking care of you today.  I believe you have a UTI and have sent a prescription for antibiotics to your pharmacy.  You will take these twice daily for 5 days.  For your incision pain I want you to continue to use Tylenol for the pain and be sure to keep it dry.  Regarding your blood pressures that have been elevated since arrival.  I started you on medications for blood pressure.  One is called Lasix and you will take it daily for 5 days.  The other medication is called Procardia and you will take that tablet daily until blood pressure check and they will determine if you need to continue to take it.  I have sent a note to your primary OB provider to have you scheduled for a blood pressure check next Monday.  This will be on your MyChart so please be sure to go to that appointment.  If you have any worsening symptoms or concerns please return for further evaluation.  I hope you have a wonderful afternoon!  Ha sido un placer cuidarte hoy.  Creo que tiene una infeccin urinaria y ha enviado una receta de antibiticos a su farmacia.  Los [employer] veces al da durante [address] [place].  Para el dolor de la incisin, quiero que contine usando Tylenol para Chief Technology Officer y asegrese de mantenerlo seco.  Con respecto a su presin arterial que se ha elevado desde su llegada.  Te inici con medicamentos para la presin arterial.  Uno se llama Lasix y lo tomars diariamente durante [address]  [address].  El otro medicamento se llama Procardia y usted tomar esa tableta diariamente hasta que le controlen la presin arterial y determinarn si necesita continuar tomndola.  He enviado una nota a su proveedor de obstetricia primaria para que programe un control de la presin arterial el prximo lunes.  Esto estar en su MyChart, as que asegrese de ir a esa cita.  Si tiene algn empeoramiento de los sntomas o inquietudes, regrese para Hotel manager.  Espero que tengis Altha tarde!

## 2023-09-07 NOTE — MAU Provider Note (Signed)
History     CSN: [id number]  Arrival date and time: 09/07/23 1042   Event Date/Time   First Provider Initiated Contact with Patient 09/07/23 1244      Chief Complaint  Patient presents with   Abdominal Pain   Dysuria   Polyuria  Spanish speaker, interpreter used for evaluation.  HPI Patient is postpartum day 9 after cesarean delivery presenting for dysuria.  She is also having pain around her incision.  Patient reports that the dysuria has increased over the past few days and she is not having severe burning with urination.  She also reports pain that she thinks is around the incision of her C-section.  Denies any fevers or chills.  Denies any other symptoms.  On arrival patient also had elevated blood pressure which was severe range, repeat mild range elevations.  Denies any headaches, change in vision, worsening swelling. OB History     Gravida  3   Para  3   Term  3   Preterm      AB      Living  3      SAB      IAB      Ectopic      Multiple  0   Live Births  3           Past Medical History:  Diagnosis Date   Anemia    GERD (gastroesophageal reflux disease)    PONV (postoperative nausea and vomiting)     Past Surgical History:  Procedure Laterality Date   BREAST LUMPECTOMY WITH RADIOACTIVE SEED LOCALIZATION Right 11/30/2021   Procedure: RIGHT BREAST LUMPECTOMY WITH RADIOACTIVE SEED LOCALIZATION;  Surgeon: Harriette Bouillon, MD;  Location: [facility];  Service: General;  Laterality: Right;   CESAREAN SECTION     x2   CESAREAN SECTION WITH BILATERAL TUBAL LIGATION N/A 08/29/2023   Procedure: CESAREAN SECTION WITH BILATERAL TUBAL LIGATION;  Surgeon: [place] Bing, MD;  Location: [facility];  Service: Obstetrics;  Laterality: N/A;   HEMORRHOID SURGERY     TUBAL LIGATION Bilateral 08/29/2023   Procedure: BILATERAL TUBAL LIGATION;  Surgeon: [place] Bing, MD;  Location: [facility];  Service: Obstetrics;  Laterality: Bilateral;     Family History  Problem Relation Age of Onset   Uterine cancer Mother    Kidney disease Father    Breast cancer Neg Hx     Social History   Tobacco Use   Smoking status: Never    Passive exposure: Never   Smokeless tobacco: Never  Vaping Use   Vaping status: Never Used  Substance Use Topics   Alcohol use: Not Currently   Drug use: Never    Allergies: No Known Allergies  Medications Prior to Admission  Medication Sig Dispense Refill Last Dose/Taking   docusate sodium (COLACE) 100 MG capsule Take 1 capsule (100 mg total) by mouth 2 (two) times daily as needed. 30 capsule 2 09/06/2023   ferrous sulfate 325 (65 FE) MG tablet Take 1 tablet (325 mg total) by mouth every other day. 90 tablet 1 09/06/2023   ibuprofen (ADVIL) 600 MG tablet Take 1 tablet (600 mg total) by mouth every 6 (six) hours as needed. 30 tablet 0 09/06/2023   oxyCODONE (OXY IR/ROXICODONE) 5 MG immediate release tablet Take 1-2 tablets (5-10 mg total) by mouth every 6 (six) hours as needed for moderate pain (pain score 4-6). Patient to take no more than 6 tablets per day 30 tablet 0 Past Week  Prenatal Vit-Fe Fumarate-FA (PRENATAL COMPLETE) 14-0.4 MG TABS Take 1 tablet by mouth daily. 120 tablet 0 09/07/2023    Review of Systems  Constitutional:  Negative for chills and fever.  HENT:  Negative for congestion and rhinorrhea.   Gastrointestinal:  Positive for abdominal pain. Negative for constipation, nausea and vomiting.  Genitourinary:  Positive for dysuria, pelvic pain, urgency and vaginal bleeding. Negative for flank pain, vaginal discharge and vaginal pain.  Neurological:  Negative for headaches.   Physical Exam   Blood pressure (!) 159/81, pulse (!) 52, temperature 98.4 F (36.9 C), resp. rate 18, height 5\' 2"  (1.575 m), weight 72.2 kg, SpO2 99%, unknown if currently breastfeeding.  Physical Exam Vitals reviewed.  Constitutional:      Appearance: She is well-developed.  HENT:     Head:  Normocephalic and atraumatic.     Mouth/Throat:     Mouth: Mucous membranes are moist.  Eyes:     Extraocular Movements: Extraocular movements intact.  Cardiovascular:     Rate and Rhythm: Normal rate and regular rhythm.  Abdominal:     General: Abdomen is flat. A surgical scar is present.     Tenderness: There is no abdominal tenderness. There is no right CVA tenderness, left CVA tenderness or guarding.     Comments: Skin incision healing well, moist on initial evaluation but no erythema, excessive drainage.  Appears to be healing well  Skin:    General: Skin is warm.     Capillary Refill: Capillary refill takes less than 2 seconds.  Neurological:     General: No focal deficit present.     Mental Status: She is alert.  Psychiatric:        Mood and Affect: Mood normal.     MAU Course  Procedures  MDM CBC CMP Urinalysis Urine culture   Assessment and Plan  Byrd Hesselbach del Transito lameeka dungee is a 42 year old who is postpartum day 9 after repeat C-section.  She is presenting for dysuria, surgical scar pain.  Found to have elevated blood pressure during the evaluation.  Dysuria Signs and symptoms consistent with UTI.  Urinalysis showing blood in her urine, leukocytes, protein.  WBC normal.  Will treat UTI with Macrobid.  Urine culture sent.  No signs of pyelonephritis.  Return precautions given.  Elevated blood pressure Elevated blood pressure since arrival with mild range and several severe range blood pressures.  No other signs of preeclampsia.  May be related to pain.  Given the consistent elevation will start on low-dose Procardia daily and Lasix 20 mg daily for 5 days.  Discussed return precautions and patient will have follow-up in the clinic on Monday for blood pressure check.  Surgical scar concerns Appears to be healing well, provided support belt.  Discussed making sure it stays dry and clean.  Cyndi Lennert Moranda Billiot 09/07/2023, 1:35 PM

## 2023-09-07 NOTE — MAU Note (Signed)
.  Dana Gibbs is a 42 y.o. at Unknown here in MAU reporting: sent from [facility] c-section last week.  Reports a burning sensation when she  urinate . Has had this sensation since they removed the catheter after the c-section. Feels like she can't empty her bladder completely. Pt is also wanting to have her c-section scar because she has a burning sensation on the right side of her abd . Not sure if the burning is coming from the inside or the outside.   Pt c/o headache sine Tuesday ,. Reports her eye feel like they have "palpations " (twitching)  LMP:  Onset of complaint: tuesday Pain score: 7 (Headache) Vitals:   09/07/23 1048 09/07/23 1100  BP: (!) 151/92 (!) 166/90  Pulse: (!) 58 (!) 54  Resp: 18   Temp: 98.4 F (36.9 C)   SpO2: 99%      FHT:n/a Lab orders placed from triage: u/a sent from ED

## 2023-09-07 NOTE — ED Triage Notes (Signed)
Pt/translator stated, I had a C-section a week ago and having bladder pain . Dana Gibbs been urinating a lot and strong pain when I urinate for a week

## 2023-09-07 NOTE — ED Provider Triage Note (Signed)
Emergency Medicine Provider Triage Evaluation Note  Dana Gibbs , a 42 y.o. female  was evaluated in triage.  Pt complains of pain at site of C-section, as well as burning with urination, and frequent urination.  C-section date 08/29/2023.  She called her clinic and they told her to come to the ER..  Review of Systems  Positive: Dysuria, polyuria, pelvic pain Negative:   Physical Exam  BP (!) 151/92 (BP Location: Left Arm)   Pulse (!) 58   Temp 98.4 F (36.9 C)   Resp 18   LMP 11/29/2022   SpO2 99%  Gen:   Awake, no distress   Resp:  Normal effort  MSK:   Moves extremities without difficulty  Other:  Nontoxic, nonseptic appearing  Medical Decision Making  Medically screening exam initiated at 10:55 AM.  Appropriate orders placed.  Dana Gibbs was informed that the remainder of the evaluation will be completed by another provider, this initial triage assessment does not replace that evaluation, and the importance of remaining in the ED until their evaluation is complete.  Spoke with Alecia Lemming, MAU APP who agrees to take this patient in transfer for MAU evaluation.   Olene Floss, [place] 09/07/23 1107

## 2023-09-08 ENCOUNTER — Ambulatory Visit: Payer: Self-pay

## 2023-09-08 LAB — URINE CULTURE: Culture: NO GROWTH

## 2023-09-26 ENCOUNTER — Ambulatory Visit: Payer: Self-pay | Admitting: Obstetrics

## 2023-10-17 ENCOUNTER — Telehealth: Payer: Self-pay | Admitting: *Deleted

## 2023-10-17 NOTE — Telephone Encounter (Signed)
Patient contacted via interpreter [phone] Patient had question regarding her hospital bills she is receiving. I advise her I don't know anything about how that works but I would see if someone could call  at talk with her. Patient agreed

## 2023-10-17 NOTE — Telephone Encounter (Signed)
Called pt and advised to speak to Mikle Bosworth, Firefighter, for any questions about [place] financial assistance. Gave pt contact number and scheduled time and dates

## 2023-11-06 ENCOUNTER — Ambulatory Visit (INDEPENDENT_AMBULATORY_CARE_PROVIDER_SITE_OTHER): Payer: Self-pay | Admitting: Family

## 2023-11-06 VITALS — BP 118/82 | HR 68 | Temp 97.9°F | Ht 60.5 in | Wt 151.8 lb

## 2023-11-06 DIAGNOSIS — Z603 Acculturation difficulty: Secondary | ICD-10-CM

## 2023-11-06 DIAGNOSIS — Z7689 Persons encountering health services in other specified circumstances: Secondary | ICD-10-CM

## 2023-11-06 DIAGNOSIS — Z758 Other problems related to medical facilities and other health care: Secondary | ICD-10-CM

## 2023-11-06 DIAGNOSIS — Z98891 History of uterine scar from previous surgery: Secondary | ICD-10-CM

## 2023-11-06 NOTE — Progress Notes (Signed)
 Subjective:    Dana Gibbs - 43 y.o. female MRN [id number]  Date of birth: Nov 26, 1980  HPI  Dana Gibbs is to establish care.   Current issues and/or concerns: - States she was not seen for her postpartum follow-up due to states [facility] required her to pay out of pocket for office visit with them. States she is feeling well since cesarean section, no issues/concerns. - Intermittent bilateral ear pain. Denies red flag symptoms.  - No further issues/concerns for discussion today.  ROS per HPI    Health Maintenance:  Health Maintenance Due  Topic Date Due   DTaP/Tdap/Td (1 - Tdap) Never done   INFLUENZA VACCINE  Never done   COVID-19 Vaccine (1 - 2024-25 season) Never done     Past Medical History: Patient Active Problem List   Diagnosis Date Noted   Postoperative anemia due to acute blood loss 08/31/2023   History of cesarean delivery, currently pregnant 08/29/2023   S/P cesarean section 08/29/2023   Positive GBS test 08/15/2023   History of 2 cesarean sections 07/02/2023   Request for sterilization 07/02/2023   Language barrier 07/02/2023   Advanced maternal age in multigravida 04/20/2023   Supervision of high risk pregnancy, antepartum 03/22/2023   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 She has never been exposed to tobacco smoke. She has never used smokeless tobacco. She reports that she does not currently use alcohol. She reports that she does not use drugs.   Family History  family history includes Kidney disease in her father; Uterine cancer in her mother.   Medications: reviewed and updated   Objective:   Physical Exam BP 118/82   Pulse 68   Temp 97.9 F (36.6 C) (Oral)   Ht 5' 0.5" (1.537 m)   Wt 151 lb 12.8 oz (68.9 kg)   SpO2 98%   BMI 29.16 kg/m   Physical Exam HENT:     Head: Normocephalic and  atraumatic.     Right Ear: Tympanic membrane, ear canal and external ear normal.     Left Ear: Tympanic membrane, ear canal and external ear normal.     Nose: Nose normal.     Mouth/Throat:     Mouth: Mucous membranes are moist.     Pharynx: Oropharynx is clear.  Eyes:     Extraocular Movements: Extraocular movements intact.     Conjunctiva/sclera: Conjunctivae normal.     Pupils: Pupils are equal, round, and reactive to light.  Cardiovascular:     Rate and Rhythm: Normal rate and regular rhythm.     Pulses: Normal pulses.     Heart sounds: Normal heart sounds.  Pulmonary:     Effort: Pulmonary effort is normal.     Breath sounds: Normal breath sounds.  Abdominal:     General: Bowel sounds are normal.     Palpations: Abdomen is soft.     Comments: Cesarean surgical scar clean/dry/intact.  Musculoskeletal:        General: Normal range of motion.     Cervical back: Normal range of motion and neck supple.  Neurological:     General: No focal deficit present.     Mental Status: She is alert and oriented to person, place, and time.  Psychiatric:        Mood and Affect: Mood normal.        Behavior:  Behavior normal.       Assessment & Plan:  1. Encounter to establish care (Primary) - Patient presents today to establish care. During the interim follow-up with primary provider as scheduled.  - Return for annual physical examination, labs, and health maintenance. Arrive fasting meaning having no food for at least 8 hours prior to appointment. You may have only water or black coffee. Please take scheduled medications as normal.  2. S/P cesarean section - Referral to Gynecology for evaluation/management. - Ambulatory referral to Gynecology  3. Language barrier - [facility] in-person interpreter, Alondra.   Patient was given clear instructions to go to Emergency Department or return to medical center if symptoms don't improve, worsen, or new problems develop.The patient verbalized  understanding.  I discussed the assessment and treatment plan with the patient. The patient was provided an opportunity to ask questions and all were answered. The patient agreed with the plan and demonstrated an understanding of the instructions.   The patient was advised to call back or seek an in-person evaluation if the symptoms worsen or if the condition fails to improve as anticipated.    Ricky Stabs, NP 11/06/2023, 4:02 PM [facility] at [facility]

## 2023-11-06 NOTE — Progress Notes (Signed)
 Patient is here for her post partum visit.   Wants Flu vaccine.

## 2023-11-20 ENCOUNTER — Ambulatory Visit: Payer: Self-pay | Admitting: Obstetrics & Gynecology

## 2023-11-20 VITALS — BP 109/74 | HR 77 | Wt 154.1 lb

## 2023-11-20 DIAGNOSIS — R300 Dysuria: Secondary | ICD-10-CM

## 2023-11-20 DIAGNOSIS — Z758 Other problems related to medical facilities and other health care: Secondary | ICD-10-CM

## 2023-11-20 DIAGNOSIS — Z603 Acculturation difficulty: Secondary | ICD-10-CM

## 2023-11-20 DIAGNOSIS — Z9889 Other specified postprocedural states: Secondary | ICD-10-CM

## 2023-11-20 DIAGNOSIS — Z98891 History of uterine scar from previous surgery: Secondary | ICD-10-CM

## 2023-11-20 LAB — POCT URINALYSIS DIPSTICK
Bilirubin, UA: NEGATIVE
Glucose, UA: NEGATIVE
Ketones, UA: NEGATIVE
Leukocytes, UA: NEGATIVE
Nitrite, UA: NEGATIVE
Protein, UA: POSITIVE — AB
Spec Grav, UA: 1.025 (ref 1.010–1.025)
Urobilinogen, UA: 0.2 U/dL
pH, UA: 6 (ref 5.0–8.0)

## 2023-11-20 NOTE — Addendum Note (Signed)
 Addended by: Salome Holmes on: 11/20/2023 11:16 AM   Modules accepted: Orders

## 2023-11-20 NOTE — Progress Notes (Signed)
 Post Partum Visit Note  Dana Gibbs is a 43 y.o. [phone] female who presents for a postpartum visit. She is  11  weeks postpartum following a repeat cesarean section.  I have fully reviewed the prenatal and intrapartum course. The delivery was at 39 gestational weeks. She had a scheduled RLTCS and Bilateral salpingectomy. She had a transfusion due to significant anemia post op (down to 5.6)  Anesthesia: spinal. Postpartum course has been good. Baby is doing well yes. Baby is feeding by breast. Bleeding no bleeding. Bowel function is normal. Bladder function is normal. Patient is not sexually active. Contraception method is tubal ligation. Postpartum depression screening: negative.   The pregnancy intention screening data noted above was reviewed. Potential methods of contraception were discussed. The patient elected to proceed with No data recorded.   Edinburgh Postnatal Depression Scale - 11/20/23 1011       Edinburgh Postnatal Depression Scale:  In the Past 7 Days   I have been able to laugh and see the funny side of things. 0    I have looked forward with enjoyment to things. 0    I have blamed myself unnecessarily when things went wrong. 0    I have been anxious or worried for no good reason. 0    I have felt scared or panicky for no good reason. 0    Things have been getting on top of me. 0    I have been so unhappy that I have had difficulty sleeping. 0    I have felt sad or miserable. 0    I have been so unhappy that I have been crying. 1    The thought of harming myself has occurred to me. 0    Edinburgh Postnatal Depression Scale Total 1             Health Maintenance Due  Topic Date Due   DTaP/Tdap/Td (1 - Tdap) Never done   INFLUENZA VACCINE  Never done   COVID-19 Vaccine (1 - 2024-25 season) Never done    The following portions of the patient's history were reviewed and updated as appropriate: allergies, current medications, past  family history, past medical history, past social history, past surgical history, and problem list.  Review of Systems Pertinent items are noted in HPI. Pap normal 2023  Objective:  BP 109/74   Pulse 77   Wt 154 lb 1.6 oz (69.9 kg)   LMP 11/08/2023   Breastfeeding Yes   BMI 29.60 kg/m    General:  alert   Breasts:  normal  Lungs: clear to auscultation bilaterally  Heart:  regular rate and rhythm, S1, S2 normal, no murmur, click, rub or gallop  Abdomen: soft, non-tender; bowel sounds normal; no masses,  no organomegaly   Wound well approximated incision  GU exam:  not indicated       Assessment:      normal postpartum exam.  Occasional dysuria- ua sent  Plan:   Essential components of care per ACOG recommendations:  1.  Mood and well being: Patient with negative depression screening today. Reviewed local resources for support.  - Patient tobacco use? No.   - hx of drug use? No.    2. Infant care and feeding:  - baby is doing well, breast and bottle feeding  3. Sexuality, contraception and birth spacing - s/p bilateral salpingectomy   4. Sleep and fatigue -Encouraged family/partner/community support of 4 hrs of uninterrupted sleep to help  with mood and fatigue  5. Physical Recovery   6.  Health Maintenance  - Last pap smear 2023, no history of abnormal paps    Salome Holmes, CMA [facility] [employer], [facility]

## 2023-11-20 NOTE — Progress Notes (Signed)
 Pt presents for pp. Pt is having back and chest pain, and burning sensation while urinating. NegInocente Salles screening

## 2024-04-03 ENCOUNTER — Other Ambulatory Visit: Payer: Self-pay

## 2024-04-03 ENCOUNTER — Ambulatory Visit
Admission: EM | Admit: 2024-04-03 | Discharge: 2024-04-03 | Disposition: A | Discharge status: Home / Self Care | Payer: Self-pay

## 2024-04-03 DIAGNOSIS — M51369 Other intervertebral disc degeneration, lumbar region without mention of lumbar back pain or lower extremity pain: Secondary | ICD-10-CM | POA: Insufficient documentation

## 2024-04-03 DIAGNOSIS — J069 Acute upper respiratory infection, unspecified: Secondary | ICD-10-CM

## 2024-04-03 DIAGNOSIS — M533 Sacrococcygeal disorders, not elsewhere classified: Secondary | ICD-10-CM | POA: Insufficient documentation

## 2024-04-03 DIAGNOSIS — M5126 Other intervertebral disc displacement, lumbar region: Secondary | ICD-10-CM | POA: Insufficient documentation

## 2024-04-03 DIAGNOSIS — M7918 Myalgia, other site: Secondary | ICD-10-CM | POA: Insufficient documentation

## 2024-04-03 DIAGNOSIS — M545 Low back pain, unspecified: Secondary | ICD-10-CM | POA: Insufficient documentation

## 2024-04-03 LAB — POC SARS CORONAVIRUS 2 AG -  ED: SARS Coronavirus 2 Ag: NEGATIVE

## 2024-04-03 NOTE — ED Provider Notes (Signed)
 [facility]    CSN: [id number] Arrival date & time: 04/03/24  1147      History   Chief Complaint Chief Complaint  Patient presents with   Headache   Chest Discomfort    HPI Dana Gibbs is a 43 y.o. female.   Patient here today for evaluation of headache, congestion and cough.  She reports that she has had some chest discomfort on the left due to symptoms.  She has had chills as well.  She is concerned primarily for COVID.  Her son has similar symptoms.  The history is provided by the patient. The history is limited by a language barrier. A language interpreter was used.  Headache Associated symptoms: congestion and cough   Associated symptoms: no abdominal pain, no diarrhea, no ear pain, no fever, no nausea, no sore throat and no vomiting     Past Medical History:  Diagnosis Date   Anemia    GERD (gastroesophageal reflux disease)    PONV (postoperative nausea and vomiting)     Patient Active Problem List   Diagnosis Date Noted   Displacement of lumbar intervertebral disc without myelopathy 04/03/2024   Degeneration of lumbar intervertebral disc 04/03/2024   HNP (herniated nucleus pulposus), lumbar 04/03/2024   Low back pain, unspecified 04/03/2024   Myofascial pain 04/03/2024   Pain of both sacroiliac joints 04/03/2024   Postoperative anemia due to acute blood loss 08/31/2023   History of cesarean delivery, currently pregnant 08/29/2023   S/P cesarean section 08/29/2023   Positive GBS test 08/15/2023   History of 2 cesarean sections 07/02/2023   Request for sterilization 07/02/2023   Language barrier 07/02/2023   Advanced maternal age in multigravida 04/20/2023   Supervision of high risk pregnancy, antepartum 03/22/2023   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    Past Surgical History:  Procedure Laterality Date   BREAST LUMPECTOMY WITH RADIOACTIVE SEED LOCALIZATION Right 11/30/2021   Procedure: RIGHT  BREAST LUMPECTOMY WITH RADIOACTIVE SEED LOCALIZATION;  Surgeon: Vanderbilt Ned, MD;  Location: [facility];  Service: General;  Laterality: Right;   CESAREAN SECTION     x2   CESAREAN SECTION WITH BILATERAL TUBAL LIGATION N/A 08/29/2023   Procedure: CESAREAN SECTION WITH BILATERAL TUBAL LIGATION;  Surgeon: Izell Harari, MD;  Location: [facility];  Service: Obstetrics;  Laterality: N/A;   HEMORRHOID SURGERY     TUBAL LIGATION Bilateral 08/29/2023   Procedure: BILATERAL TUBAL LIGATION;  Surgeon: Izell Harari, MD;  Location: [facility];  Service: Obstetrics;  Laterality: Bilateral;    OB History     Gravida  5   Para  5   Term  5   Preterm      AB      Living  5      SAB      IAB      Ectopic      Multiple      Live Births  5            Home Medications    Prior to Admission medications   Medication Sig Start Date End Date Taking? Authorizing Provider  Ferrous Sulfate  (IRON PO) Take 1 tablet by mouth every morning. 11/24/22  Yes [provider]  ibuprofen  (ADVIL ) 600 MG tablet Take 1 tablet (600 mg total) by mouth every 6 (six) hours as needed. 08/31/23  Yes Loreli Iha D, CNM  acetaminophen  (TYLENOL ) 500 MG tablet Take 2 tablets (1,000 mg total)  by mouth every 6 (six) hours as needed for mild pain (pain score 1-3), moderate pain (pain score 4-6), fever or headache. 09/07/23   Cresenzo, John V, MD  Ascorbic Acid (VITAMIN C PO) Vitamin C    [provider]  Cyclobenzaprine  HCl (FLEXERIL  PO) Flexeril     [provider]  docusate sodium  (COLACE) 100 MG capsule Take 1 capsule (100 mg total) by mouth 2 (two) times daily as needed. Patient not taking: Reported on 11/06/2023 07/13/23   Zina Jerilynn LABOR, MD  ferrous sulfate  325 (65 FE) MG tablet Take 1 tablet (325 mg total) by mouth every other day. Patient not taking: Reported on 11/06/2023 07/02/23   Erik Kieth BROCKS, MD  Folic Acid-Cholecalciferol (FOLVITE-D PO)  Folvite-D    [provider]  furosemide  (LASIX ) 20 MG tablet Take 1 tablet (20 mg total) by mouth daily. Patient not taking: Reported on 11/06/2023 09/07/23 09/06/24  Cresenzo, John V, MD  Ibuprofen  (ADVIL ) 200 MG CAPS Advil     [provider]  Multiple Vitamin (MULTIVITAMIN ADULT PO) Multivitamin    [provider]  NIFEdipine  (PROCARDIA -XL/NIFEDICAL-XL) 30 MG 24 hr tablet Take 1 tablet (30 mg total) by mouth daily. Patient not taking: Reported on 11/06/2023 09/07/23 09/06/24  Cresenzo, John V, MD  oxyCODONE  (OXY IR/ROXICODONE ) 5 MG immediate release tablet Take 1-2 tablets (5-10 mg total) by mouth every 6 (six) hours as needed for moderate pain (pain score 4-6). Patient to take no more than 6 tablets per day Patient not taking: Reported on 11/06/2023 08/31/23   Eldonna Suzen Octave, MD  Prenatal Vit-Fe Fumarate-FA (PRENATAL COMPLETE) 14-0.4 MG TABS Take 1 tablet by mouth daily. 01/12/23   Lowella Benton CROME, PA    Family History Family History  Problem Relation Age of Onset   Uterine cancer Mother    Kidney disease Father    Breast cancer Neg Hx     Social History Social History   Tobacco Use   Smoking status: Never    Passive exposure: Never   Smokeless tobacco: Never  Vaping Use   Vaping status: Never Used  Substance Use Topics   Alcohol use: Not Currently   Drug use: Never     Allergies   Patient has no known allergies.   Review of Systems Review of Systems  Constitutional:  Positive for chills. Negative for fever.  HENT:  Positive for congestion. Negative for ear pain and sore throat.   Eyes:  Negative for discharge and redness.  Respiratory:  Positive for cough. Negative for shortness of breath and wheezing.   Gastrointestinal:  Negative for abdominal pain, diarrhea, nausea and vomiting.  Neurological:  Positive for headaches.     Physical Exam Triage Vital Signs ED Triage Vitals  Encounter Vitals Group     BP 04/03/24 1209 116/77      Girls Systolic BP Percentile --      Girls Diastolic BP Percentile --      Boys Systolic BP Percentile --      Boys Diastolic BP Percentile --      Pulse Rate 04/03/24 1209 67     Resp 04/03/24 1209 18     Temp 04/03/24 1209 98.9 F (37.2 C)     Temp Source 04/03/24 1209 Temporal     SpO2 04/03/24 1209 96 %     Weight 04/03/24 1206 154 lb 1.6 oz (69.9 kg)     Height 04/03/24 1206 5' 0.5 (1.537 m)     Head Circumference --  Peak Flow --      Pain Score 04/03/24 1200 9     Pain Loc --      Pain Education --      Exclude from Growth Chart --    No data found.  Updated Vital Signs BP 116/77 (BP Location: Left Arm)   Pulse 67   Temp 98.9 F (37.2 C) (Temporal)   Resp 18   Ht 5' 0.5 (1.537 m)   Wt 154 lb 1.6 oz (69.9 kg)   LMP 03/28/2024 (Exact Date)   SpO2 96%   Breastfeeding Yes   BMI 29.60 kg/m   Visual Acuity Right Eye Distance:   Left Eye Distance:   Bilateral Distance:    Right Eye Near:   Left Eye Near:    Bilateral Near:     Physical Exam Vitals and nursing note reviewed.  Constitutional:      General: She is not in acute distress.    Appearance: Normal appearance. She is not ill-appearing.  HENT:     Head: Normocephalic and atraumatic.     Right Ear: Tympanic membrane normal.     Left Ear: Tympanic membrane normal.     Nose: Congestion present.     Mouth/Throat:     Mouth: Mucous membranes are moist.     Pharynx: No oropharyngeal exudate or posterior oropharyngeal erythema.  Eyes:     Conjunctiva/sclera: Conjunctivae normal.  Cardiovascular:     Rate and Rhythm: Normal rate and regular rhythm.     Heart sounds: Normal heart sounds. No murmur heard. Pulmonary:     Effort: Pulmonary effort is normal. No respiratory distress.     Breath sounds: Normal breath sounds. No wheezing, rhonchi or rales.  Skin:    General: Skin is warm and dry.  Neurological:     Mental Status: She is alert.  Psychiatric:        Mood and Affect: Mood normal.         Thought Content: Thought content normal.      UC Treatments / Results  Labs (all labs ordered are listed, but only abnormal results are displayed) Labs Reviewed  POC SARS CORONAVIRUS 2 AG -  ED - Normal    EKG   Radiology No results found.  Procedures Procedures (including critical care time)  Medications Ordered in UC Medications - No data to display  Initial Impression / Assessment and Plan / UC Course  I have reviewed the triage vital signs and the nursing notes.  Pertinent labs & imaging results that were available during my care of the patient were reviewed by me and considered in my medical decision making (see chart for details).    EKG without concerning findings.  Patient's symptoms seem more consistent with upper respiratory infection.  COVID screening negative.  Advised symptomatic treatment, encourage fluids and rest with follow-up if no gradual improvement with any further concerns.  Final Clinical Impressions(s) / UC Diagnoses   Final diagnoses:  Acute upper respiratory infection   Discharge Instructions   None    ED Prescriptions   None    PDMP not reviewed this encounter.   Billy Asberry FALCON, PA-C 04/04/24 1815

## 2024-04-03 NOTE — ED Triage Notes (Signed)
 Due to language barrier, an interpreter was present during the history-taking and subsequent discussion (and for part of the physical exam) with this patient. Dana Gibbs. Number: [id number].   I am here with my son who also has a ha, my symptoms started with feeling sick (ha, light headed and left sided chest pain, very sharp, now the same on/off today, really bad shivers, chills.
